# Patient Record
Sex: Female | Born: 1950 | Race: White | Hispanic: No | Marital: Married | Smoking: Never smoker
Health system: Southern US, Community
[De-identification: ages and names within clinical notes are randomized; demographics above are authoritative.]

## PROBLEM LIST (undated history)

## (undated) DIAGNOSIS — K8020 Calculus of gallbladder without cholecystitis without obstruction: Secondary | ICD-10-CM

## (undated) DIAGNOSIS — R112 Nausea with vomiting, unspecified: Secondary | ICD-10-CM

## (undated) DIAGNOSIS — G894 Chronic pain syndrome: Secondary | ICD-10-CM

## (undated) DIAGNOSIS — G4730 Sleep apnea, unspecified: Secondary | ICD-10-CM

## (undated) DIAGNOSIS — T4145XA Adverse effect of unspecified anesthetic, initial encounter: Secondary | ICD-10-CM

## (undated) DIAGNOSIS — G43909 Migraine, unspecified, not intractable, without status migrainosus: Secondary | ICD-10-CM

## (undated) DIAGNOSIS — F32A Depression, unspecified: Secondary | ICD-10-CM

## (undated) DIAGNOSIS — M1990 Unspecified osteoarthritis, unspecified site: Secondary | ICD-10-CM

## (undated) DIAGNOSIS — F329 Major depressive disorder, single episode, unspecified: Secondary | ICD-10-CM

## (undated) DIAGNOSIS — T8859XA Other complications of anesthesia, initial encounter: Secondary | ICD-10-CM

## (undated) DIAGNOSIS — Z9889 Other specified postprocedural states: Secondary | ICD-10-CM

## (undated) DIAGNOSIS — E785 Hyperlipidemia, unspecified: Secondary | ICD-10-CM

## (undated) DIAGNOSIS — M5030 Other cervical disc degeneration, unspecified cervical region: Secondary | ICD-10-CM

## (undated) DIAGNOSIS — N390 Urinary tract infection, site not specified: Secondary | ICD-10-CM

## (undated) HISTORY — PX: SACROILIAC JOINT FUSION: SHX6088

## (undated) HISTORY — PX: OTHER SURGICAL HISTORY: SHX169

## (undated) HISTORY — PX: CERVICAL DISC SURGERY: SHX588

## (undated) HISTORY — DX: Calculus of gallbladder without cholecystitis without obstruction: K80.20

## (undated) HISTORY — PX: NASAL SINUS SURGERY: SHX719

## (undated) HISTORY — DX: Hyperlipidemia, unspecified: E78.5

## (undated) HISTORY — PX: CHOLECYSTECTOMY: SHX55

## (undated) HISTORY — DX: Depression, unspecified: F32.A

## (undated) HISTORY — DX: Migraine, unspecified, not intractable, without status migrainosus: G43.909

## (undated) HISTORY — DX: Sleep apnea, unspecified: G47.30

## (undated) HISTORY — DX: Unspecified osteoarthritis, unspecified site: M19.90

## (undated) HISTORY — PX: ABDOMINAL HYSTERECTOMY: SHX81

## (undated) HISTORY — DX: Chronic pain syndrome: G89.4

## (undated) HISTORY — PX: TONSILLECTOMY: SUR1361

## (undated) HISTORY — DX: Major depressive disorder, single episode, unspecified: F32.9

---

## 1996-08-06 HISTORY — PX: OTHER SURGICAL HISTORY: SHX169

## 1997-12-16 ENCOUNTER — Ambulatory Visit (HOSPITAL_COMMUNITY): Admission: RE | Admit: 1997-12-16 | Discharge: 1997-12-16 | Payer: Self-pay | Admitting: Specialist

## 1998-02-09 ENCOUNTER — Inpatient Hospital Stay (HOSPITAL_COMMUNITY): Admission: RE | Admit: 1998-02-09 | Discharge: 1998-02-13 | Payer: Self-pay | Admitting: Specialist

## 1998-07-13 ENCOUNTER — Encounter: Admission: RE | Admit: 1998-07-13 | Discharge: 1998-10-11 | Payer: Self-pay | Admitting: Anesthesiology

## 1998-12-02 ENCOUNTER — Other Ambulatory Visit: Admission: RE | Admit: 1998-12-02 | Discharge: 1998-12-02 | Payer: Self-pay | Admitting: Obstetrics & Gynecology

## 1999-01-23 ENCOUNTER — Ambulatory Visit (HOSPITAL_COMMUNITY): Admission: RE | Admit: 1999-01-23 | Discharge: 1999-01-23 | Payer: Self-pay | Admitting: Specialist

## 1999-01-23 ENCOUNTER — Encounter: Payer: Self-pay | Admitting: Specialist

## 1999-04-14 ENCOUNTER — Encounter: Payer: Self-pay | Admitting: Specialist

## 1999-04-14 ENCOUNTER — Ambulatory Visit (HOSPITAL_COMMUNITY): Admission: RE | Admit: 1999-04-14 | Discharge: 1999-04-14 | Payer: Self-pay | Admitting: Specialist

## 1999-08-07 HISTORY — PX: OTHER SURGICAL HISTORY: SHX169

## 2000-03-01 ENCOUNTER — Other Ambulatory Visit: Admission: RE | Admit: 2000-03-01 | Discharge: 2000-03-01 | Payer: Self-pay | Admitting: Obstetrics and Gynecology

## 2000-07-26 ENCOUNTER — Encounter: Payer: Self-pay | Admitting: Specialist

## 2000-07-26 ENCOUNTER — Ambulatory Visit (HOSPITAL_COMMUNITY): Admission: RE | Admit: 2000-07-26 | Discharge: 2000-07-26 | Payer: Self-pay | Admitting: Specialist

## 2000-08-06 HISTORY — PX: ROTATOR CUFF REPAIR: SHX139

## 2001-05-09 ENCOUNTER — Other Ambulatory Visit: Admission: RE | Admit: 2001-05-09 | Discharge: 2001-05-09 | Payer: Self-pay | Admitting: Obstetrics and Gynecology

## 2001-05-28 ENCOUNTER — Encounter: Payer: Self-pay | Admitting: Specialist

## 2001-05-29 ENCOUNTER — Inpatient Hospital Stay (HOSPITAL_COMMUNITY): Admission: EM | Admit: 2001-05-29 | Discharge: 2001-05-30 | Payer: Self-pay | Admitting: Specialist

## 2001-09-04 ENCOUNTER — Encounter: Admission: RE | Admit: 2001-09-04 | Discharge: 2001-12-03 | Payer: Self-pay

## 2001-12-04 ENCOUNTER — Encounter: Admission: RE | Admit: 2001-12-04 | Discharge: 2002-03-04 | Payer: Self-pay

## 2002-03-30 ENCOUNTER — Encounter: Admission: RE | Admit: 2002-03-30 | Discharge: 2002-05-05 | Payer: Self-pay

## 2002-06-15 ENCOUNTER — Other Ambulatory Visit: Admission: RE | Admit: 2002-06-15 | Discharge: 2002-06-15 | Payer: Self-pay | Admitting: Obstetrics and Gynecology

## 2002-07-23 ENCOUNTER — Encounter: Admission: RE | Admit: 2002-07-23 | Discharge: 2002-10-21 | Payer: Self-pay

## 2002-10-21 ENCOUNTER — Encounter: Admission: RE | Admit: 2002-10-21 | Discharge: 2003-01-19 | Payer: Self-pay

## 2003-01-22 ENCOUNTER — Encounter
Admission: RE | Admit: 2003-01-22 | Discharge: 2003-04-22 | Payer: Self-pay | Admitting: Physical Medicine & Rehabilitation

## 2003-04-20 ENCOUNTER — Encounter
Admission: RE | Admit: 2003-04-20 | Discharge: 2003-07-19 | Payer: Self-pay | Admitting: Physical Medicine & Rehabilitation

## 2003-04-26 ENCOUNTER — Encounter
Admission: RE | Admit: 2003-04-26 | Discharge: 2003-04-26 | Payer: Self-pay | Admitting: Physical Medicine & Rehabilitation

## 2003-04-26 ENCOUNTER — Encounter: Payer: Self-pay | Admitting: Physical Medicine & Rehabilitation

## 2003-06-23 ENCOUNTER — Other Ambulatory Visit: Admission: RE | Admit: 2003-06-23 | Discharge: 2003-06-23 | Payer: Self-pay | Admitting: Obstetrics and Gynecology

## 2004-08-09 ENCOUNTER — Other Ambulatory Visit: Admission: RE | Admit: 2004-08-09 | Discharge: 2004-08-09 | Payer: Self-pay | Admitting: Obstetrics and Gynecology

## 2004-09-29 ENCOUNTER — Ambulatory Visit: Payer: Self-pay | Admitting: Cardiology

## 2004-10-31 ENCOUNTER — Encounter: Admission: RE | Admit: 2004-10-31 | Discharge: 2004-10-31 | Payer: Self-pay | Admitting: Obstetrics and Gynecology

## 2004-11-16 ENCOUNTER — Ambulatory Visit: Payer: Self-pay | Admitting: Cardiology

## 2005-09-10 ENCOUNTER — Other Ambulatory Visit: Admission: RE | Admit: 2005-09-10 | Discharge: 2005-09-10 | Payer: Self-pay | Admitting: Obstetrics and Gynecology

## 2005-11-02 ENCOUNTER — Encounter: Admission: RE | Admit: 2005-11-02 | Discharge: 2005-11-02 | Payer: Self-pay | Admitting: Obstetrics and Gynecology

## 2006-02-01 ENCOUNTER — Ambulatory Visit: Payer: Self-pay | Admitting: Cardiology

## 2006-02-13 ENCOUNTER — Ambulatory Visit: Payer: Self-pay | Admitting: Cardiology

## 2006-11-04 ENCOUNTER — Ambulatory Visit: Payer: Self-pay | Admitting: Gastroenterology

## 2006-11-11 ENCOUNTER — Encounter: Admission: RE | Admit: 2006-11-11 | Discharge: 2006-11-11 | Payer: Self-pay | Admitting: Obstetrics and Gynecology

## 2007-01-01 ENCOUNTER — Ambulatory Visit: Payer: Self-pay | Admitting: Gastroenterology

## 2007-01-08 ENCOUNTER — Ambulatory Visit: Payer: Self-pay | Admitting: Gastroenterology

## 2007-02-04 ENCOUNTER — Ambulatory Visit: Payer: Self-pay | Admitting: Cardiology

## 2007-08-19 ENCOUNTER — Encounter: Admission: RE | Admit: 2007-08-19 | Discharge: 2007-08-19 | Payer: Self-pay | Admitting: Anesthesiology

## 2007-12-12 ENCOUNTER — Encounter: Admission: RE | Admit: 2007-12-12 | Discharge: 2007-12-12 | Payer: Self-pay | Admitting: Obstetrics and Gynecology

## 2008-04-21 ENCOUNTER — Ambulatory Visit: Payer: Self-pay | Admitting: Cardiology

## 2008-04-21 LAB — CONVERTED CEMR LAB
ALT: 22 units/L (ref 0–35)
Cholesterol: 111 mg/dL (ref 0–200)
HDL: 83.7 mg/dL (ref >39.0)
Total Protein: 6.8 g/dL (ref 6.0–8.3)
Triglycerides: 38 mg/dL (ref 0–149)
VLDL: 8 mg/dL (ref 0–40)

## 2008-09-01 ENCOUNTER — Encounter: Admission: RE | Admit: 2008-09-01 | Discharge: 2008-09-01 | Payer: Self-pay | Admitting: Specialist

## 2008-09-08 ENCOUNTER — Ambulatory Visit (HOSPITAL_BASED_OUTPATIENT_CLINIC_OR_DEPARTMENT_OTHER): Admission: RE | Admit: 2008-09-08 | Discharge: 2008-09-08 | Payer: Self-pay | Admitting: Specialist

## 2008-09-08 HISTORY — PX: OTHER SURGICAL HISTORY: SHX169

## 2008-12-20 ENCOUNTER — Encounter: Admission: RE | Admit: 2008-12-20 | Discharge: 2008-12-20 | Payer: Self-pay | Admitting: Obstetrics and Gynecology

## 2009-02-28 ENCOUNTER — Encounter (INDEPENDENT_AMBULATORY_CARE_PROVIDER_SITE_OTHER): Payer: Self-pay | Admitting: *Deleted

## 2009-06-02 DIAGNOSIS — E785 Hyperlipidemia, unspecified: Secondary | ICD-10-CM | POA: Insufficient documentation

## 2009-06-09 ENCOUNTER — Encounter: Admission: RE | Admit: 2009-06-09 | Discharge: 2009-06-09 | Payer: Self-pay | Admitting: Family Medicine

## 2009-07-04 ENCOUNTER — Telehealth: Payer: Self-pay | Admitting: Cardiology

## 2009-07-05 ENCOUNTER — Encounter (INDEPENDENT_AMBULATORY_CARE_PROVIDER_SITE_OTHER): Payer: Self-pay | Admitting: *Deleted

## 2009-07-05 ENCOUNTER — Ambulatory Visit: Payer: Self-pay | Admitting: Cardiology

## 2009-09-16 ENCOUNTER — Encounter: Admission: RE | Admit: 2009-09-16 | Discharge: 2009-09-16 | Payer: Self-pay | Admitting: Anesthesiology

## 2009-11-02 ENCOUNTER — Encounter: Admission: RE | Admit: 2009-11-02 | Discharge: 2009-11-02 | Payer: Self-pay | Admitting: Neurological Surgery

## 2009-12-21 ENCOUNTER — Encounter: Admission: RE | Admit: 2009-12-21 | Discharge: 2009-12-21 | Payer: Self-pay | Admitting: Obstetrics and Gynecology

## 2010-06-07 ENCOUNTER — Ambulatory Visit (HOSPITAL_COMMUNITY): Admission: RE | Admit: 2010-06-07 | Discharge: 2010-06-10 | Payer: Self-pay | Admitting: Neurosurgery

## 2010-07-03 ENCOUNTER — Encounter (INDEPENDENT_AMBULATORY_CARE_PROVIDER_SITE_OTHER): Payer: Self-pay | Admitting: *Deleted

## 2010-08-27 ENCOUNTER — Encounter: Payer: Self-pay | Admitting: Physical Medicine & Rehabilitation

## 2010-08-27 ENCOUNTER — Encounter: Payer: Self-pay | Admitting: Neurological Surgery

## 2010-08-27 ENCOUNTER — Encounter: Payer: Self-pay | Admitting: Family Medicine

## 2010-09-07 NOTE — Letter (Signed)
Summary: Appointment - Reminder 2  [facility]. [address] [place] [address]   Phone: [phone]  Fax: [phone]     July 03, 2010 MRN: [id number]   [facility] [address] [place], [place]  [address]   Dear Ms. Bommarito,  Our records indicate that it is time to schedule a follow-up appointment.  Dr.  Daleen Squibb        recommended that you follow up with [place] in   06/2010 PAST DUE         . It is very important that we reach you to schedule this appointment. We look forward to participating in your health care needs. Please contact us at the number listed above at your earliest convenience to schedule your appointment.  If you are unable to make an appointment at this time, give [place] a call so we can update our records.     Sincerely,   Glass blower/designer

## 2010-10-17 LAB — BASIC METABOLIC PANEL
Calcium: 9 mg/dL (ref 8.4–10.5)
GFR calc Af Amer: >60 mL/min (ref >60)
GFR calc non Af Amer: >60 mL/min (ref >60)
Glucose, Bld: 121 mg/dL — ABNORMAL HIGH (ref 70–99)
Sodium: 141 mEq/L (ref 135–145)

## 2010-10-18 LAB — CBC
MCH: 28.2 pg (ref 26.0–34.0)
MCHC: 31.8 g/dL (ref 30.0–36.0)
Platelets: 243 10*3/uL (ref 150–400)
RBC: 4.79 MIL/uL (ref 3.87–5.11)

## 2010-10-18 LAB — BASIC METABOLIC PANEL
CO2: 29 mEq/L (ref 19–32)
Calcium: 9.6 mg/dL (ref 8.4–10.5)
Creatinine, Ser: 0.77 mg/dL (ref 0.4–1.2)
GFR calc Af Amer: >60 mL/min (ref >60)

## 2010-10-18 LAB — SURGICAL PCR SCREEN: MRSA, PCR: NEGATIVE

## 2010-11-20 LAB — BASIC METABOLIC PANEL
Calcium: 9.5 mg/dL (ref 8.4–10.5)
Chloride: 101 mEq/L (ref 96–112)
Creatinine, Ser: 0.92 mg/dL (ref 0.4–1.2)
GFR calc Af Amer: >60 mL/min (ref >60)
GFR calc non Af Amer: >60 mL/min (ref >60)

## 2010-11-20 LAB — CBC
RBC: 4.78 MIL/uL (ref 3.87–5.11)
WBC: 9.8 10*3/uL (ref 4.0–10.5)

## 2010-12-19 NOTE — Op Note (Signed)
Melody Huerta, Melody Huerta                ACCOUNT NO.:  [id number]   MEDICAL RECORD NO.:  [id number]          PATIENT TYPE:  AMB   LOCATION:  [facility]                         FACILITY:  [facility]   PHYSICIAN:  Jene Every, M.D.    DATE OF BIRTH:  [birth year]   DATE OF PROCEDURE:  DATE OF DISCHARGE:                               OPERATIVE REPORT   PREOPERATIVE DIAGNOSES:  1. Adhesive capsulitis.  2. Impingement syndrome of the left shoulder.   POSTOPERATIVE DIAGNOSES:  1. Adhesive capsulitis.  2. Impingement syndrome of the left shoulder.  3. Anterior labral tear.  4. Partial tear of the rotator cuff.   PROCEDURES:  1. Exam under anesthesia and manipulation under anesthesia.  2. Left shoulder arthroscopy with debridement of the glenohumeral      joint and debridement of anterior labral tear.  3. Subacromial decompression, acromioplasty, bursectomy and resection      of the CA ligament, debridement of rotator cuff tear.   ANESTHESIA:  General.   ASSISTANT:  Roma Schanz, P.A.   BRIEF HISTORY AND INDICATIONS:  This is a 60 year old female who has had  multiple problems, including cervical spondylosis.  She recently had a  frozen shoulder, refractory to conservative treatment, including  corticosteroid injections, physical therapy and activity modification,  who is indicated for a manipulation under anesthesia, evaluation of the  rotator cuff and glenohumeral joint, open rotator cuff repair if noted.  The risks and benefits discussed, including bleeding, infection,  fracture, tear, need for open repair, recurrent adhesive capsulitis,  etc.   TECHNIQUE:  With the patient in supine beach-chair position after  induction of adequate anesthesia and a gram of Kefzol, the shoulder was  examined and manipulated under anesthesia.  She had active abduction  only to 40 degrees, forward flexion to 60 degrees, external rotation to  15 degrees, internal rotation was limited as well.  First in the  forward  flexion plane, gentle pressure was applied after stabilization of the  glenohumeral joint with close securing of the humerus and we were Prunty  to achieve and appreciate lysis of adhesions with improvement of her  range of motion to 160 degrees of forward flexion.  Then we were Mcanany to  obtain abduction to 120 degrees.  An external rotation then was improved  to 40 degrees and internal rotation was improved as well.  At this  point, we felt we had achieved maximum increase in motion by  manipulation and this was compared contralateral side.  Next, the left  shoulder and upper extremities was prepped and draped in the usual  sterile fashion.  Using a surgical marker to delineate the acromion, the  [facility] joint and the coracoid, an incision posterolaterally, anterolaterally  and anteriorly through the skin only.  The usual portals were utilized.  With the arm in the 70/30 position, we advanced the cannula into the  glenohumeral space without difficulty.  This in line with the coracoid.  Noted was some tearing of the anterior labrum, minimal detachment,  minimal tearing, no full detachment.  There was no SLAP 2 type lesion.  We introduced  the shaver.  With the cannula from the anterior portal  after advancing it under direct visualization through the capsule  beneath the biceps tendon into the glenohumeral joint without  difficulty, we debrided the labrum to a stable base.  Minor degenerative  changes of the glenohumeral joint were noted.  The rotator cuff appeared  to be intact.  There was a small fraying of the rotator cuff near the  rotator cuff interval.  The subscap was unremarkable as was the  glenohumeral joint.  We then redirected the cannula into the subacromial  space in with the cannula alone, I completed lysis of adhesions  anteriorly, laterally and posteriorly.  We then transferred the cannula  from the anterior to the lateral portal and under direct visualization  proceeded  with evaluation of the subacromial joint.  Hypertrophic and  exuberant bursa and synovitis was noted and this was shaved with a 3.5  shaver.  The subacromial space appeared to be tight.  We therefore  released the morselized CA ligament and improved the subacromial space.  We shaved the anterolateral aspect of the acromion as well.  This was to  the [facility] joint.  Full bursectomy was performed.  There was a minor  superficial abrasion tear of the rotator cuff.  This was debrided to  good bleeding tissue.  A full bursectomy again was performed and we  evaluated the cuff anteriorly, posteriorly, laterally without a full-  thickness tear.  This was probed extensively.  We felt there was  significant improvement in the subacromial space following the  decompression.  Next, all instrumentation was removed.  The portals were  closed with 4-0 nylon simple sutures.  Quarter-percent Marcaine with  epinephrine was infiltrated in the joint.  The wound was dressed  sterilely, placed in a sling, extubated without difficulty and  transported to the recovery room in satisfactory condition.   The patient tolerated the procedure well will no complications.      Jene Every, M.D.  Electronically Signed     JB/MEDQ  D:  09/08/2008  T:  09/08/2008  Job:  [id number]

## 2010-12-19 NOTE — Assessment & Plan Note (Signed)
[facility]                            CARDIOLOGY OFFICE NOTE   Melody, CORREIRA                       MRN:          [id number]  DATE:04/21/2008                            DOB:          25-Apr-1951    Melody Huerta returns today for further management of her hyperlipidemia.  She is back on Vytorin 10/40 and is doing remarkably well.  She is due  lipids and LFTs.  She has no complaints of ischemic heart disease.   PHYSICAL EXAMINATION:  VITAL SIGNS:  Her blood pressure is 108/78.  Her  pulse is 71 and regular.  Her weight is 179, up 9.  HEENT:  Normal.  NECK:  Carotid upstrokes were equal bilaterally without bruits.  No JVD.  Thyroid is not enlarged.  Trachea is midline.  Neck is supple.  LUNGS:  Clear to auscultation and percussion.  HEART:  Soft S1 and S2.  No gallop.  ABDOMEN:  Soft.  Good bowel sounds.  EXTREMITIES:  No cyanosis, clubbing, or edema.  Pulses are brisk.  NEURO:  Intact.  SKIN:  Unremarkable.   EKG is normal except for some slight nonspecific changes.   Melody Huerta is doing well on Vytorin.  We will repeat her labs.  If these  were at goal, we will see her back again in a year.     Thomas C. Daleen Squibb, MD, [facility]  Electronically Signed    TCW/MedQ  DD: 04/21/2008  DT: 04/22/2008  Job #: [id number]   cc:   Guy Sandifer. Henderson Cloud, M.D.

## 2010-12-19 NOTE — Assessment & Plan Note (Signed)
[facility]                            CARDIOLOGY OFFICE NOTE   SANAI, FRICK                       MRN:          [id number]  DATE:02/04/2007                            DOB:          1951-05-18    Ms. Diluzio returns today for further management of her hyperlipidemia.   Unfortunately, she had difficulty getting an appointment with me back in  the winter.  She then saw Dr. Thyra Breed, who she sees for chronic  pain, who told her Vytorin was not working.  I guess this was based on a  peripheral understanding of the ENHANCE trial.  Unfortunately, she has  not been on the drug for 3 months.   She continues with her therapeutic lifestyle changes, and her weight  loss is about 15 to 18 pounds since we first met in 2006.  She is out of  her Vytorin 10/40.   The rest of her medications are unchanged, except now she is on Opana 30  mg q.12 for chronic pain, and she is also taking Celebrex 200 mg a day.   Her blood pressure is 118/74.  Pulse is 71 and regular.  Her weight is  170.  HEENT:  Normocephalic and atraumatic.  PERRLA.  Extraocular movements  intact.  Sclerae are clear.  Facial symmetry is normal.  Carotid upstrokes were equal bilaterally without bruits.  No JVD.  Thyroid is not enlarged.  Trachea is midline.  LUNGS:  Clear.  HEART:  Reveals a regular rate and rhythm without gallop.  ABDOMINAL EXAM:  Soft with good bowel sounds.  EXTREMITIES:  No cyanosis, clubbing, or edema.  Pulses are intact.  NEUROLOGIC:  Exam is intact.  EKG:  Normal.   I had a long talk (greater than 20 minutes) with Ms. Miano.  We reviewed  the ENHANCE trial, and its limitations.  After our discussion, she wants  to go back on Vytorin 10/40.  She had excellent results with this  before.   PLAN:  1. Begin Vytorin 10/40 nightly.  2. Follow up lipids and LFTs in 6 weeks.  3. See me back in a year.   Hopefully in 2 years we will have some good outcome to data that  will  make statistical significance with the IMPROVE IT trial.     Maisie Fus C. Daleen Squibb, MD, [facility]  Electronically Signed    TCW/MedQ  DD: 02/04/2007  DT: 02/05/2007  Job #: [id number]   cc:   Guy Sandifer. Henderson Cloud, M.D.

## 2010-12-22 NOTE — Consult Note (Signed)
NAME:  Melody Huerta, Melody Huerta                            ACCOUNT NO.:   MEDICAL RECORD NO.:  [id number]                   PATIENT TYPE:   LOCATION:                                       FACILITY:  [facility]   PHYSICIAN:  Sondra Come, D.O.                 DATE OF BIRTH:   DATE OF CONSULTATION:  04/29/2002  DATE OF DISCHARGE:                                   CONSULTATION   Ms. Dipalma returns to clinic today as scheduled for reevaluation.  She was  last seen on April 01, 2002.  Overall, she is doing very well with her  current medication regimen.  She continues on Duragesic 50 mcg patches with  rare use of oxycodone for breakthrough pain.  She typically uses  approximately 10 oxycodone per month.  She also continues on Elavil 25 mg at  bedtime and Neurontin 300 mg t.i.d.  She takes Celebrex 200 mg b.i.d.  She  states that she continues her walking program and aquatic therapy, but has  discontinued the aggressive exercise program with her personal trainer and  massage therapist secondary to her persistent aggravation of her left  iliotibial band syndrome.  She feels that the rest has decreased her left  lateral thigh pain and knee pain to some degree.  She did not really get any  significant improvement with her iliotibial band syndrome following a local  steroid injection at last visit.  She is satisfied with her current pain  control.  Her pain today is rated as a 5/10 on a subjective scale but she  seems to be functioning very well with a stable function and quality of life  measure.  Her sleep is good.  I reviewed the health and history form and 14  point review of systems.  No new neurologic complaints.   PHYSICAL EXAMINATION:  GENERAL:  Healthy female in no acute distress.  VITAL SIGNS:  Blood pressure 119/59, pulse 77, respirations 16, O2  saturation 98% on room air.  BACK:  Level pelvis without scoliosis.  There is minimal tenderness to  palpation in the lumbar paraspinal muscles.  There  is significant tenderness  over the left sacroiliac joint region.  NEUROLOGIC:  Manual muscle testing is 5/5 bilateral lower extremities.  Sensory examination intact to light touch bilateral lower extremities.  Muscle stretch reflexes are 2+/4 bilateral patellar, medial hamstrings, and  Achilles.  There is mild tenderness to palpation over the left lateral knee  at the iliotibial band.   IMPRESSION:  1. Chronic low back pain with degenerative disk disease of the lumbar spine     status post L4-5 and L5-S1 fusion with ray cages and pedicle screws.  2. Left sacroiliac pain status post left sacroiliac joint fusion.  3. Left iliotibial band syndrome, improved.  4. Status post bilateral pyriformis release.   PLAN:  1. Continue conservative measures at this point.  Will continue Duragesic 50     mcg q.72h. number 10 without refills.  2. Continue Elavil 25 mg one p.o. q.h.s. as needed for sleep number 90 with     one refill.  This is a three month prescription with refill.  3. Will trial decreasing the Celebrex to 200 mg q.d.  4. Continue Neurontin 300 mg t.i.d. for now.  5. Continue glucosamine and chondroitin.  6. Continue home exercise and aquatic exercise program.  7. The patient is to return to clinic in three months for reevaluation.  I     have instructed her to call us at least 72 hours prior to running out of     her medications for refills on the Duragesic.  The patient has been very     compliant over the past several months with her medication regimen and I     think we can start spreading out her appointment visits in this regard.   The patient was educated on the above findings and recommendations and  understands.  No barriers to communication.                                               Sondra Come, D.O.    JJW/MEDQ  D:  04/29/2002  T:  04/29/2002  Job:  [id number]

## 2010-12-22 NOTE — Consult Note (Signed)
[name], [name]                          ACCOUNT NO.:  [id number]   MEDICAL RECORD NO.:  [id number]                   PATIENT TYPE:  REC   LOCATION:  [facility]                                  FACILITY:  [facility]   PHYSICIAN:  Zachary George, DO                      DATE OF BIRTH:  Oct 20, 1950   DATE OF CONSULTATION:  10/22/2002  DATE OF DISCHARGE:                                   CONSULTATION   HISTORY OF PRESENT ILLNESS:  The patient returns to the clinic today for  reevaluation.  She was last seen on September 24, 2002.  She underwent  trigger point injection to the right levator scapulae muscle which she  states helped significantly, but she requests another injection.  Her pain  in that region is back to baseline.  She has been receiving what sounds like  massage-type therapy.  She is also self-treating with use of a tennis ball.  Her pain today is 6/10 on a subjective scale.  She continues to have lower  back pain which has been stable.  She continues using Duragesic 50 mcg  patches as well as hydrocodone 10 mg/325 mg as needed and brings her pills  with her for appropriate pill count.  She has more than half of her previous  prescription left.  I reviewed the health and history form and 14-point  review of systems.   PHYSICAL EXAMINATION:  GENERAL:  Healthy female in no acute distress.  VITAL SIGNS:  Blood pressure 120/72, pulse 83, respirations 20, O2  saturation 99% on room air.  NEUROLOGIC:  Intact in the upper and lower extremities including motor,  sensory, and reflexes.  Palpatory examination reveals an active trigger  point in the right levator scapulae muscle.   IMPRESSION:  1. Myofascial pain syndrome with above-noted trigger point.  2. Chronic low back pain.  3. Degenerative disk disease of the lumbar spine, status post L4-5 and L5-S1     fusion with Ray cages and pedical screws.  4. Left sacroiliac joint pain, chronic, status post left sacroiliac joint     infusion.  5. Left iliotibial band syndrome, chronic and stable.   PLAN:  1. Repeat trigger point injection to the right levator scapulae muscle.  2. Continue Duragesic 50 mcg q.72h., #10 without refills.  3. Continue Norco 10 mg/325 mg, 1/2-1 p.o. three times daily as needed for     breakthrough pain.  The patient is using this sparingly and     appropriately.  4. Patient to return to clinic in one month for reevaluation.   PROCEDURE:  Trigger point injection right levator scapulae muscle:  The  procedure was described to the patient in detail including risks, benefits,  limitations, alternatives, and potential side effects.  The risks include  but are not limited to bleeding, infection, nerve injury, pneumothorax,  failure to relieve pain, increased  pain, and allergic reaction to  medication.  The patient understands and wishes to proceed.  Informed  consent was obtained.  The skin was prepped in the usual fashion with  alcohol swabs.  The right levator scapulae trigger point was injected with  0.5 mL of 1% lidocaine plus 0.5 mL of 0.25% bupivacaine using a 25-gauge, 1-  1/2-inch needle.  There were no complications.  The patient tolerated the  procedure well.  Discharge instructions were given.  The patient was  released in stable condition.   The patient was educated on the above findings and recommendations and  understands.  There were no barriers to communication.                                               Zachary George, DO    JW/MEDQ  D:  10/22/2002  T:  10/23/2002  Job:  [id number]

## 2010-12-22 NOTE — Consult Note (Signed)
[facility]  Patient:    Melody Huerta, Melody Huerta Visit Number: [id number] MRN: [id number]          Service Type: PMG Location: [facility] Attending Physician:  Melody Huerta Dictated by:   Melody Huerta, D.O. Proc. Date: 12/31/01 Admit Date:  12/04/2001   CC:         Melody Huerta, M.D.   Consultation Report  Melody Huerta returns to clinic today as scheduled for reevaluation.  She was last seen on Dec 08, 2001.  She continues to do very well in terms of pain management with Duragesic 50 mg q.72h.  She also continues on Celebrex 200 mg b.i.d. and Neurontin 300 mg t.i.d.  She is sleeping great with Elavil 25 mg at bedtime.  She has used oxycodone 5 mg for breakthrough pain very sparingly. In fact, she has greater than 20 pills left on her prescription written on November 06, 2001 for a total of 60 pills.  I review health and history form and 14 point review of systems.  Patients pain level is a 3/10 on a subjective scale.  Her function and quality of life indexes have improved significantly. She continues her exercise program.  Overall, she has made significant progress.  PHYSICAL EXAMINATION  GENERAL:  Healthy female in no acute distress.  VITAL SIGNS:  Blood pressure 133/59, pulse 86, respirations 12, O2 saturation 99% on room air.  BACK:  Minimal tenderness to palpation bilateral lumbar paraspinals.  NEUROLOGIC:  Manual muscle testing is 5/5 bilateral lower extremities. Sensory examination intact to light touch bilateral lower extremities.  Muscle stretch reflexes are 3+/4 bilateral patellar, 2+/4 bilateral medial hamstrings and Achilles.  IMPRESSION: 1. Chronic low back pain with degenerative disk disease of the lumbar spine    status post L4-5 and L5-S1 fusion with ray cages and pedicle screws. 2. Status post left sacroiliac joint fusion. 3. Status post bilateral pyriformis release. 4. Status post right rotator cuff repair.  PLAN: 1. Continue Duragesic  50 mcg q.72h. #10 without refills. 2. Continue current prescription for oxycodone 5 mg p.r.n. for breakthrough    pain. 3. Continue Neurontin 300 mg t.i.d. #270 without refills.  This will be mailed    in. 4. Continue Elavil 25 mg one p.o. q.h.s. #90 without refills, to be mailed in. 5. Continue Celebrex 200 mg one p.o. b.i.d. #180 without refills, to be mailed    in. 6. Patient to return to clinic in one month.  Patient was educated on the above findings and recommendations and understands.  No barriers to communication. Dictated by:   Melody Huerta, D.O. Attending Physician:  Melody Huerta DD:  12/31/01 TD:  01/01/02 Job: [id number]

## 2010-12-22 NOTE — Assessment & Plan Note (Signed)
[facility]                              CARDIOLOGY OFFICE NOTE   Melody Huerta, Melody Huerta                       MRN:          [id number]  DATE:02/13/2006                            DOB:          03/15/1951    Melody Huerta returns today for further management of her hyperlipidemia.  She  has lost from 185 to 168 on my scales!  She is walking about two hours a  week.  She is taking Vytorin 10/40.  Her lipid panel showed a total  cholesterol of 142, triglycerides of 36, HDL of 62.2, LDL of 73 with LFTs  being normal.   She has no complaints today.  She is extremely proactive.   Her medications are outlined in the medication list.   Her blood pressure is 112/80, pulse is 80 and regular, weight is 168.  Carotids are full, there is no JVD.  Thyroid is not enlarged.  Trachea is  midline.  There are no bruits.  LUNGS:  Clear.  HEART:  Reveals a regular rate and rhythm.  ABDOMEN:  Soft.  EXTREMITIES:  Reveal no edema.  Pulses are intact.   I am delighted with how Melody Huerta is doing. We have made no changes in her  program. We renewed her Vytorin 10/40.  We will see her back in a year.                               Thomas C. Daleen Squibb, MD, [facility]    TCW/MedQ  DD:  02/13/2006  DT:  02/13/2006  Job #:  [id number]   cc:   Guy Sandifer. Henderson Cloud, MD

## 2010-12-22 NOTE — Consult Note (Signed)
[facility]  Patient:    Melody Huerta, Melody Huerta Visit Number: [id number] MRN: [id number]          Service Type: PMG Location: [facility] Attending Physician:  Sondra Come Dictated by:   Sondra Come, D.O. Proc. Date: 12/08/01 Admit Date:  12/04/2001   CC:         Javier Docker, M.D.   Consultation Report  Melody Huerta returns to clinic today as scheduled for reevaluation.  She was last seen on November 06, 2001 at which time I increased her Duragesic dose to 50 mcg which she states has helped significantly.  Her pain has maintained a level of 3/10 on a subjective scale.  She has noticed a decrease in the need to use oxycodone for breakthrough pain.  She has used it sparingly and has used possibly half her prescribed medication.  She continues on Neurontin, Celebrex, and amitriptyline as well as glucosamine and chondroitin.  She also continues on Zoloft 100 mg daily and asked to possibly decrease this as she is no longer feeling depressed given her significant pain control.  She feels like she has gotten her life back.  She continues to participate in aquatic therapy as well as exercise with a Systems analyst.  I review health and history form and 14 point review of systems.  Her function and quality of life indexes have significantly improved.  Her sleep is better.  PHYSICAL EXAMINATION  GENERAL:  Healthy female in no acute distress.  VITAL SIGNS:  Blood pressure 137/82, pulse 80, respirations 16, O2 saturation 99% on room air.  NEUROLOGIC:  No new neurologic findings in the lower extremities including motor, sensory, and reflexes.  BACK:  There is minimal tenderness to palpation bilateral lumbar paraspinals, especially over the right sacroiliac joint region.  IMPRESSION: 1. Chronic low back pain with degenerative disk disease of the lumbar spine    status post L4-5 and L5-S1 fusion with ray cages and pedicles screws. 2. Status post left sacroiliac joint  effusion. 3. Status post bilateral piriformis release. 4. Status post right rotator cuff repair. 5. Nonrestorative sleep disorder, improved. 6. Improved function and quality of life indexes.  PLAN: 1. Continue Duragesic 50 mcg one q.72h. #10 without refills. 2. Continue current prescription for oxycodone 5 mg p.r.n. for breakthrough    pain. 3. Continue other medications including Neurontin, Celebrex, amitriptyline,    glucosamine, chondroitin. 4. Decrease Zoloft to 50 mg q.d.  Patient may break her current pills in half. 5. Patient to continue with exercise program. 6. Patient to return to clinic in one month for reevaluation.  Patient was educated on the above findings and recommendations and understands.  No barriers to communication. Dictated by:   Sondra Come, D.O. Attending Physician:  Sondra Come DD:  12/08/01 TD:  12/09/01 Job: [id number]

## 2010-12-22 NOTE — Assessment & Plan Note (Signed)
 HEALTHCARE                         GASTROENTEROLOGY OFFICE NOTE   Melody Huerta, Melody Huerta                       MRN:          [id number]  DATE:11/04/2006                            DOB:          07-May-1951    REFERRING PHYSICIAN:  Guy Sandifer. Henderson Cloud, M.D.   REASON FOR CONSULTATION:  Screening colonoscopy.   Ms. Melody Huerta is a pleasant, 60 year old, white female referred through the  courtesy of Dr. Henderson Cloud for evaluation. She suffers from chronic  constipation which she attributes to her narcotic use. She has chronic  back pain that requires a fentanyl patch. There is no history of melena  or hematochezia. She does complain of a frequent foul taste in her mouth  and halitosis. Prilosec has not helped. She denies hoarseness, sore  throat, or pyrosis, per say.   PAST MEDICAL HISTORY:  Remarkable for depression and chronic headaches.  She is status post hysterectomy. She has had several back surgeries.   FAMILY HISTORY:  Pertinent for a father with polyps.   MEDICATIONS:  Duragesic patch, Topamax, Vivelle patch, Vytorin, Remeron,  Celebrex, Effexor, __________ and chlorpromazine.   ALLERGIES:  She is allergic to PENICILLIN.   SOCIAL HISTORY:  She neither smokes nor drinks. 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   REVIEW OF SYSTEMS:  Positive for chronic low back pain, night sweats,  headaches.   PHYSICAL EXAMINATION:  GENERAL:  She is a well-developed female.  VITAL SIGNS:  Pulse 76, blood pressure 104/60, weight 171.  HEENT: EOMI. PERRLA. Sclerae are anicteric.  Conjunctivae are pink.  NECK:  Supple without thyromegaly, adenopathy or carotid bruits.  CHEST:  Clear to auscultation and percussion without adventitious  sounds.  CARDIAC:  Regular rhythm; normal S1 S2.  There are no murmurs, gallops  or rubs.  ABDOMEN:  Bowel sounds are normoactive.  Abdomen is soft, non-tender and  non-distended.  There are no abdominal masses, tenderness,  splenic  enlargement or hepatomegaly.  EXTREMITIES:  Full range of motion.  No cyanosis, clubbing or edema.  RECTAL:  Deferred.   IMPRESSION:  1. Chronic constipation - likely secondary to narcotics.  2. Dysgeusia.  This may be a manifestation of gastroesophageal reflux      disease.   RECOMMENDATION:  1. Screening colonoscopy.  2. Trial of AcipHex 30 mg a day. Failing this, I would consider upper      endoscopy.     Barbette Hair. Arlyce Dice, MD,FACG  Electronically Signed    RDK/MedQ  DD: 11/04/2006  DT: 11/04/2006  Job #: [id number]   cc:   Guy Sandifer. Henderson Cloud, M.D.

## 2010-12-22 NOTE — Letter (Signed)
November 04, 2006    Guy Sandifer. Henderson Cloud, M.D.  [address]  [place], [place] [address]   RE:  Melody Huerta, Melody Huerta  MRN:  [id number]  /  DOB:  [birth year]   Dear Dr. Henderson Cloud:   Upon your kind referral, I had the pleasure of evaluating your patient  and I am pleased to offer my findings.  I saw Melody Huerta in the office  today.  Enclosed is a copy of my progress note that details my findings  and recommendations.   Thank you for the opportunity to participate in your patient's care.    Sincerely,      Barbette Hair. Arlyce Dice, MD,FACG  Electronically Signed    RDK/MedQ  DD: 11/04/2006  DT: 11/04/2006  Job #: [id number]

## 2010-12-22 NOTE — Consult Note (Signed)
[facility]  Patient:    Melody Huerta, Melody Huerta Visit Number: [id number] MRN: [id number]          Service Type: PMG Location: [facility] Attending Physician:  Sondra Come Dictated by:   Sondra Come, D.O. Proc. Date: 09/05/01 Admit Date:  09/04/2001   CC:         Melody Huerta, M.D.   Consultation Report  Dear Dr. Shelle Huerta:  Thank you very much for kindly referring Ms. Elloise [facility] to [facility].  She was evaluated in our clinic today. Please refer to the following for details regarding the history and physical examination and treatment plan.  Once again, thank you for allowing [place] to participate in the care of Ms. Melody Huerta.  CHIEF COMPLAINT:  Low back pain and left buttock pain.  HISTORY OF PRESENT ILLNESS:  Melody Huerta is a pleasant 60 year old left hand dominant female who is status post L4-5 and L5-S1 ray cage fusion in 1999 with revision in June 2002 with pedicle screws, status post left sacroiliac joint fusion in 2001, and status post bilateral pyriformis release in December 2000 who presents to clinic today complaining of persistent pain in her low back and into her lower extremities, left greater than right.  Currently, her pain is an 8/10 on a subjective scale and described as burning sensation.  Her symptoms are worse with walking, bending, and sitting and improved with ice and medications which currently include OxyContin 20 mg b.i.d. for which she has taken over the past two years, oxycodone 5 mg two to three per day for breakthrough pain, cyclobenzaprine 10 mg as needed, but infrequently.  The patients function and quality of life indexes have declined.  Her sleep is poor despite taking Ambien 10 mg at bedtime which she has done over the past one to two years.  The patient denies bowel and bladder dysfunction.  She admits to some paraesthesias in her feet bilaterally.  She admits to constipation.  I review the  health and history form and 14 point review of systems.  PAST MEDICAL HISTORY:  Denies.  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  Patient has also undergone left sacroiliac joint fusion, bilateral pyriformis release, and right rotator cuff repair.  FAMILY HISTORY:  Heart disease, diabetes.  SOCIAL HISTORY:  Denies smoking.  Admits to occasional alcohol use.  She is married and not currently working secondary to disability.  ALLERGIES:  PENICILLIN.  MEDICATIONS: 1. Estratest. 2. Zoloft 100 mg. 3. OxyContin 20 mg b.i.d. 4. Oxycodone 5 mg two to three q.d. for breakthrough pain. 5. Ambien 10 mg at bedtime. 6. Cyclobenzaprine 10 mg p.r.n.  Patient has tried anti-inflammatory medication in the past including Celebrex, Vioxx, and Bextra which did not offer significant relief but she believes that she had only taken it for short periods of time.  PHYSICAL EXAMINATION  GENERAL:  Healthy female in no acute distress who prefers standing secondary to discomfort with sitting.  Mood and affect are appropriate.  VITAL SIGNS:  Blood pressure 141/94, pulse 105, respirations 12, O2 saturation 98%.  BACK:  Spine reveals a level pelvis without gross scoliosis.  There is decreased lumbar lordosis.  There is a midline incisional scar and scars bilaterally over the sacroiliac regions.  Range of motion is limited in all planes secondary to pain.  NEUROLOGIC:  Manual muscle  testing is 5/5 bilateral lower extremities in all muscle groups tested.  Sensory examination reveals slight decrease to light touch in the feet bilaterally, especially on the left lateral foot and plantar surface compared to the right.  Muscle stretch reflexes are 2+/4 bilateral patellar, medial hamstrings, and 1+/4 bilateral Achilles.  There is no heat, erythema, or edema in the lower  extremities.  Distal pulses are present bilaterally.  Melody Huerta is negative bilaterally.  Straight leg raise is equivocal on the left, negative on the right.  Records which were sent to our office from Dr. Shelle Huerta were reviewed.  IMPRESSION: 1. Degenerative disk disease of the lumbar spine status post L4-5 and L5-S1    fusion with ray cages and pedicle screws.  Patient has persistent low back    pain with left lower extremity radicular symptoms in an S1 distribution. 2. Status post left sacroiliac joint fusion. 3. Status post bilateral pyriformis release. 4. Status post right rotator cuff repair. 5. Nonrestorative sleep disorder.  PLAN: 1. I had a long discussion with Melody Huerta regarding her pain condition and    treatment options.  This was an extensive consultation of greater than 30    minutes face to face time.  I would like to gather more information    including most recent radiologic studies including MRI of her lumbar spine    for enhanced database. 2. Will perform electrodiagnostic studies on the left lower extremity to    evaluate for a left S1 radiculopathy. 3. Will consider further interventional procedures such as a left S1 selective    transforaminal epidural steroid injection.  I discussed this at length with    Melody Huerta who will consider this. 4. Will discontinue Ambien at this time to give her a holiday from the    medication as its effectiveness has diminished for her sleep capacity.  I    will start her on Elavil 25 mg one p.o. q.h.s. 5. Will begin Celebrex 200 mg b.i.d. and have given her two weeks worth of    samples. 6. Will begin Neurontin 300 mg p.o. q.h.s. x3 days, b.i.d. x3 days, then    t.i.d.  Will titrate this to effect to assist with neuropathic component. 7. Will continue OxyContin 20 mg b.i.d. at this time.  Patient has    approximately three weeks left on her prescription.  Will also continue    oxycodone 5 mg for breakthrough pain at this time.  Will  consider     transitioning her from OxyContin to a different long-acting narcotic such    as Duragesic patch to get her away from mono therapy which would likely be    more efficacious in treating her pain. 8. Will have her discontinue cyclobenzaprine. 9. Patient to return to clinic for electrodiagnostic studies.  Patient was educated on the above findings and recommendations and understands.  There were no barriers to communication. Dictated by:   Sondra Come, D.O. Attending Physician:  Sondra Come DD:  09/07/01 TD:  09/08/01 Job: [id number]

## 2010-12-22 NOTE — Consult Note (Signed)
[facility]  Patient:    Melody Huerta, Melody Huerta Visit Number: [id number] MRN: [id number]          Service Type: PMG Location: [facility] Attending Physician:  Sondra Come Dictated by:   Sondra Come, D.O. Proc. Date: 01/28/02 Admit Date:  12/04/2001                            Consultation Report  REASON FOR FOLLOWUP:  The patient returns to clinic today as scheduled for reevaluation.  She was last seen on Dec 31, 2001.  She continues to do very well overall in terms of pain management with Duragesic patches 50 mcg q.72h., Neurontin 300 mg three times daily, Celebrex 200 mg b.i.d., glucosamine and chondroitin as well as Elavil for sleep.  Today, she complains mainly of pain in her distal iliotibial band at the knees bilaterally, the left considerably greater than right.  She notes she has been walking more over the past few weeks and feels that her symptoms have progressed.  Her pain today is a 4/10 on a subjective scale.  Function and quality-of-life indices remain the same and improved overall.  Her sleep is great.  I reviewed the health and history form and 14-point review of systems.  PHYSICAL EXAMINATION:  GENERAL:  Physical examination reveals a healthy female in no acute distress.  VITAL SIGNS:  Blood pressure 130/59, pulse 80, respirations 16, O2 saturation 100% on room air.  EXTREMITIES:  There is tenderness to palpation mainly at the left iliotibial band at the knee.  There is some tightness of the left iliotibial band on Ober test.  NEUROLOGIC:  Neurologic exam is intact in the lower extremities without change.  IMPRESSION: 1. Left iliotibial band syndrome. 2. Chronic low back pain with degenerative disk disease of the lumbar spine,    status post L4-5 and L5-S1 fusion with Ray cages and pedicle screws. 3. Status post left sacroiliac joint fusion. 4. Status post bilateral piriformis release. 5. Status post right rotator cuff  repair.   PLAN: 1. Continue Duragesic 50 mcg patch q.72h., #10 without refills. 2. Continue oxycodone 5 mg one p.o. b.i.d. as needed for breakthrough pain,    #30 without refills. 3. Continue with homebased exercise program and working with her current    physical and massage therapists.  Recommend deep tissue massage to the    iliotibial bands with an aggressive stretching program.  Will consider    adding ultrasound versus local steroid injections. 4. The patient is to follow up in one month for reevaluation.  The patient was educated in the above findings and recommendations and understands.  No barriers to communication. Dictated by:   Sondra Come, D.O. Attending Physician:  Sondra Come DD:  01/28/02 TD:  01/30/02 Job: [id number]

## 2010-12-22 NOTE — Consult Note (Signed)
NAMECELEST, REITZ                          ACCOUNT NO.:  [id number]   MEDICAL RECORD NO.:  [id number]                   PATIENT TYPE:  REC   LOCATION:  [facility]                                  FACILITY:  [facility]   PHYSICIAN:  Zachary George, DO                      DATE OF BIRTH:  04-Sep-1950   DATE OF CONSULTATION:  09/24/2002  DATE OF DISCHARGE:                                   CONSULTATION   REASON FOR CONSULTATION:  The patient returns to clinic today for re-  evaluation.  She is mainly complaining of neck pain, pointing to the C7-T1  level, and states that it radiates across her right upper back and  occasionally to her right upper extremity.  Her pain today is a 6 to 7/10 on  a subjective scale.  She continues to have significant pain across her lower  back, and feels that the Duragesic 50 mcg does not seem to give as much  relief as it once had.  She is currently taking oxycodone 5 mg b.i.d.  consistently, where as previously she was only using it very sparingly.  She  is uncertain of whether or not this is somehow related to the change in  weather this winter.  I reviewed health and history form and 14 point review  of systems.  Function and quality of life indices remain stable.  Sleep is  good.  She continues also on Celebrex 200 mg b.i.d., and Neurontin 300 mg  t.i.d., as well as Elavil 25 mg at bedtime as needed.   PHYSICAL EXAMINATION:  GENERAL:  A healthy female in no acute distress.  VITAL SIGNS:  Blood pressure is 126/75, pulse 90, respirations 18, O2  saturation 99% on room air.  BACK:  There is an active trigger point in the right levator scapulae muscle  reproducing the patient's upper extremity symptoms and her upper back pain  and neck pain.  NEUROLOGIC:  Intact in the upper extremities, including motor, sensory, and  reflexes.  Spurling maneuver is negative bilaterally.  No new neurological  findings of the lower extremities today.   IMPRESSION:  1. 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  2. Chronic low back pain.  3. Degenerative disk disease of the lumbar spine, status post L4-5 and L5-S1     fusion with Ray cages and pedicle screws.  4. Left sacroiliac pain, status post left sacroiliac joint fusion.  5. Chronic left iliotibial band syndrome.   PLAN:  1. Repeat trigger point injection to the right levator scapulae muscle.  2. Continue Duragesic 50 mcg q.72h. #10 without refills.  If symptoms are     not improving, would consider switching to another long-acting opioid     analgesic such as MS Contin, Evinsa, or Kadian.  I do not feel that  escalation of the Duragesic dose is warranted at this time.  3. Discontinue oxycodone and begin Norco 10 mg/325 mg 1/2 to one p.o. t.i.d.     p.r.n. breakthrough pain #60 without refills.  We will monitor the     patient's usage pattern.  4. The patient is to return to clinic in one month for re-evaluation.   PROCEDURE:  Trigger point injection, right levator scapulae muscle.   DESCRIPTION OF PROCEDURE:  The procedure was described to the patient in  detail, including risks, benefits, limitations, and alternatives.  Risks  include, but are not limited to bleeding, infection, pneumothorax, nerve  injury, failure to relieve pain, increased pain, and allergic reaction to  medication.  The patient understands and wishes to proceed.  Informed  consent was obtained.  Skin was prepped in the usual fashion with alcohol  swabs.  The right levator scapulae muscle was injected with 1 cc of 0.25%  bupivacaine plus 1 cc of 1% lidocaine using a 25 gauge 1.5 inch needle.  Twitch responses were noted.  The patient tolerated the procedure well.  There were no complications.  Discharge instructions were given.  The  patient was released in stable condition.   The patient was educated on the above findings and recommendations and  understands.  There were no barriers to  communication.                                               Zachary George, DO    JW/MEDQ  D:  09/24/2002  T:  09/25/2002  Job:  [id number]

## 2010-12-22 NOTE — Consult Note (Signed)
[facility]  Patient:    Melody, Huerta Visit Number: [id number] MRN: [id number]          Service Type: PMG Location: [facility] Attending Physician:  Sondra Come Dictated by:   Sondra Come, D.O. Proc. Date: 10/09/01 Admit Date:  09/04/2001   CC:         Melody Huerta, M.D.   Consultation Report  Melody Huerta returns to clinic today as scheduled for reevaluation.  She was last seen on September 25, 2001 at which time I started her on Duragesic 25 mcg patch for chronic low back pain status post L4-5 and L5-S1 fusion.  The patient states that her symptoms are significantly improved with the Duragesic.  She continues to take oxycodone 5 mg b.i.d. as needed for breakthrough pain.  She notes that her function and quality of life indexes have significantly improved.  Her sleep is good.  In addition, she notes that the pain she was experiencing in her right upper back secondary to myofascial trigger point has resolved following a trigger point injection.  Her pain today is a 5/10 on a subjective scale.  The patient denies any new neurologic complaints.  Denies any radicular component at this time.  She continues on Neurontin 300 mg t.i.d.  She also continues on amitriptyline 25 mg at bedtime and Celebrex 200 mg b.i.d.  Overall, she is making considerable improvement.  PHYSICAL EXAMINATION  GENERAL:  Healthy female in no acute distress.  VITAL SIGNS:  Blood pressure 134/75, pulse 89, respirations 18, O2 saturation 100% on room air.  BACK:  Tenderness to palpation over the lumbosacral paraspinous region, especially on the left.  NEUROLOGIC:  Manual muscle testing is 5/5 bilateral lower extremities. Sensory examination is intact to light touch bilateral lower extremities. Muscle stretch reflexes are 2+/4 bilateral patellar, medial hamstrings and 1+/4 bilateral Achilles.  IMPRESSION: 1. Chronic low back pain with degenerative disk disease of the lumbar  spine    status post L4-5 and L5-S1 fusion with Ray cages and pedicle screws. 2. Status post left sacroiliac joint fusion. 3. Status post bilateral pyriformis release. 4. Status post right rotator cuff repair. 5. Resolved myofascial pain, right upper back. 6. Nonrestorative sleep disorder, improved.  PLAN: 1. Continue Duragesic 25 mcg one q.72h. #10 without refills. 2. Continue oxycodone 5 mg one p.o. b.i.d. p.r.n. for breakthrough pain #60    without refills. 3. Continue Celebrex 200 mg one p.o. b.i.d. #180 without refills. 4. Continue Neurontin 300 mg one p.o. t.i.d. #270 without refills. 5. Continue Elavil 25 mg one p.o. q.h.s. #90 without refills. 6. Patient to return to clinic in one month for reevaluation.  The patient was educated on the above findings and recommendations and understands.  There were no barriers to communication. Dictated by:   Sondra Come, D.O. Attending Physician:  Sondra Come DD:  10/09/01 TD:  10/10/01 Job: [id number]

## 2010-12-22 NOTE — Consult Note (Signed)
Melody Huerta, Melody Huerta                          ACCOUNT NO.:  [id number]   MEDICAL RECORD NO.:  [id number]                   PATIENT TYPE:  REC   LOCATION:  [facility]                                  FACILITY:  [facility]   PHYSICIAN:  Zachary George, DO                      DATE OF BIRTH:  June 29, 1951   DATE OF CONSULTATION:  07/24/2002  DATE OF DISCHARGE:                                   CONSULTATION   REASON FOR CONSULTATION:  The patient returns to clinic today for  reevaluation.  She continues to have complaints of lower back pain involving  her lumbar paraspinous region as well as the sacroiliac joint region.  She  also has persistent left lateral thigh pain secondary to iliotibial band  syndrome, which has been chronic.  She states she has had a busy holiday  season and has not been going her aquatic therapy in approximately a month  and I suspect that her pain has increased secondary to her decreasing her  exercise program as well as the stress of the holidays.  Her pain today is a  5/10 on a subjective scale.  She continues to use the Duragesic 50 mcg patch  q.72h..  She also uses oxycodone 5 mg for breakthrough pain and has been  using it very sparingly; in fact, she has not used up her entire  prescription of 50 pills which were prescribed back in October.  She also  continues on amitriptyline 25 mg at bedtime, Neurontin 300 mg t.i.d. and  Celebrex 200 mg b.i.d.  She requests refills on the Duragesic, oxycodone and  Celebrex today.  She denies any new neurologic complaints, although she does  complain of recurrence of her right upper back pain.  She underwent a  trigger point injection in February of 2003 for trigger points in the right  upper trapezius and levator scapulae.  She states that her symptoms now are  similar to those that she experienced previously.  She had a great result  with the trigger point injections.  I reviewed the health and history form  and 14-point review of  systems.   PHYSICAL EXAMINATION:  GENERAL:  Physical examination reveals a healthy  female in no acute distress.  VITAL SIGNS:  Blood pressure 128/46, pulse 88, respirations 16, O2  saturation 98% on room air.  NEUROMUSCULAR:  Manual muscle testing is 5/5, bilateral upper and lower  extremities.  Sensory examination is intact to light touch, bilateral upper  and lower extremities.  Muscle stretch reflexes are symmetric, bilateral  upper and lower extremities.  There is tenderness to palpation in the lumbar  paraspinous region.  There was also tenderness to palpation along the  iliotibial band on the left.  There is an active trigger point noted in the  right levator scapulae muscle.  There is mild tenderness to palpation in the  upper trapezius on the right but without an active trigger point.   IMPRESSION:  1. Myofascial pain syndrome with above-noted trigger point.  2. Chronic low back pain.  3. Degenerative disk disease of the lumbar spine, status post L4-5 and L5-S1     fusion with Ray cages and pedical screws.  4. Left sacroiliac pain, status post left sacroiliac joint fusion.  5. Chronic left iliotibial band syndrome.   PLAN:  1. Discussed further treatment options with the patient in terms of her     current pain symptoms.  I would like her to get back into aquatic therapy     after the holidays are over.  I think the resumption of an exercise     program will bring her pain back to previous levels.  I do not think it     is warranted at this point to escalate her narcotic-based pain     medication.  In fact, we will keep the Duragesic the same at 50 mcg     q.72h. and I have refilled this for one month.  2. Continue with oxycodone 5 mg one p.o. b.i.d. as needed for breakthrough     pain, #50 without refills.  The patient has been using this appropriately     and sparingly.  3. Continue Celebrex 200 mg one p.o. b.i.d., #180 with 1 refill.  4. Continue Neurontin and Elavil  for which she has prescriptions.  5. In terms of patient's myofascial upper back pain, will perform a trigger     point injection to the right levator scapulae muscle.  6. Patient to return to clinic in three months or sooner as needed.  Would     consider repeating trigger point injection if symptoms do not entirely     resolve.   PROCEDURE:  Trigger point injection.   INFORMED CONSENT:  Procedure was described to patient in detail including  risks, benefits, limitations and alternatives.  She wishes to proceed.  Informed consent was obtained.   DESCRIPTION OF PROCEDURE:  Skin was prepped with alcohol swabs.  The right  levator scapulae trigger point was injected with 0.5 cc of 1% lidocaine plus  0.5 cc of 0.25% bupivacaine using a 25-gauge 1-1/2-inch needle.  The patient  tolerated the procedure well.   Discharge instructions given.  No complications noted.   Patient was educated in the above findings and recommendations and  understands.  No barriers to communication.                                               Zachary George, DO    JW/MEDQ  D:  07/24/2002  T:  07/25/2002  Job:  [id number]

## 2010-12-22 NOTE — Consult Note (Signed)
[facility]  Patient:    LIVIAH, CAKE Visit Number: [id number] MRN: [id number]          Service Type: Attending:  Sondra Come, D.O. Dictated by:   Sondra Come, D.O. Proc. Date: 02/25/02                            Consultation Report  Ms. Marsalis returns to clinic today as scheduled for reevaluation.  She was last seen on January 28, 2002.  She brings her used patches and pills with her for appropriate pill count.  She states she has been more active over the past several weeks and feels like this is contributing to increased pain over the past few weeks.  Her pain today is a 6/10 on a subjective scale.  I review health and history form and 14 point review of systems.  Function and quality of life indexes remain essentially the same.  Denies any new neurologic complaints.  PHYSICAL EXAMINATION  GENERAL:  Healthy female in no acute distress.  VITAL SIGNS:  Blood pressure 115/70, pulse 81, respirations 16, O2 saturation 97% on room air.  NEUROLOGIC:  Manual muscle testing is 5/5 bilateral lower extremities. Sensory examination is intact to light touch bilateral lower extremities. Muscle stretch reflexes are 2+/4 bilateral patellar and medial hamstrings and Achilles today.  Palpatory examination of the left lateral thigh reveals significant tenderness, especially over the ITB at the knee.  IMPRESSION: 1. Chronic low back pain with degenerative disk disease of the lumbar spine    status post L4-5 and L5-S1 fusion with ray cages and pedicle screws. 2. Left iliotibial band syndrome. 3. Status post left sacroiliac joint fusion. 4. Status post bilateral pyriformis release.  PLAN: 1. Continue Duragesic 50 mcg patch q.72h. #10 without refills. 2. Continue oxycodone 5 mg one p.o. b.i.d. as needed for breakthrough pain #50    without refills. 3. Continue with massage therapy for deep friction massage to the iliotibial    band with myofascial release.   Consider local steroid injection if symptoms    are not improving. 4. Continue home based exercise program. 5. Continue Celebrex and Neurontin as well as Elavil. 6. Discussed activity modification. 7. Will begin Lidoderm 5% patches to use up to 12 hours per day on lower back    #30 with one refill. 8. The patient is to return to clinic in five weeks for reevaluation as she    has enough medications to last her until that time.  The patient was educated on the above findings and recommendations and understands.  No barriers to communication. Dictated by:   Sondra Come, D.O. Attending:  Sondra Come, D.O. DD:  02/25/02 TD:  03/01/02 Job: [id number]

## 2010-12-22 NOTE — Consult Note (Signed)
[facility]  Patient:    Melody Huerta, Melody Huerta Visit Number: [id number] MRN: [id number]          Service Type: PMG Location: [facility] Attending Physician:  Sondra Come Dictated by:   Sondra Come, D.O. Admit Date:  09/04/2001   CC:         Javier Docker, M.D.   Consultation Report  HISTORY OF PRESENT ILLNESS:  The patient returns to the clinic today as scheduled for reevaluation. Since I saw her last on September 11, 2001, the patient states she is doing significantly better in terms of her lower extremity radicular symptoms with the addition of Neurontin 300 mg 3 times daily, Elavil 25 mg a bedtime for sleep restoration, and Celebrex 200 mg p.o. bid. She continues to take OxyContin 20 mg b.i.d. and again expresses a desire to come off of OxyContin and has even tried skipping her evening dose but she states that she has had increased pain when she wakes up in the morning. She has a prescription for oxycodone 5 mg to take for breakthrough pain, but she has not had to take this. She also continues to take glucosamine and chondroitin. Her main complaint today is pain in her right upper back, pointing to her upper trapezius and levator scapulae region. She states that this has occasionally caused intermittent paresthesias in her right upper extremity when she is typing on a computer which results with rest. Her pain today is a 7/10 on a subjective scale. Function and quality of life indices have improved. Sleep is great. I review health and history form and 14-point review of systems.  PHYSICAL EXAMINATION:  GENERAL:  A healthy female in no acute distress.  VITAL SIGNS:  Blood pressure 130/77, pulse 85, respirations 10, O2 saturation 100% on room air.  EXTREMITIES:  Palpatory examination reveals trigger points in the right upper trapezius and levator scapulae. Manual muscle testing is 5/5 bilateral upper and lower extremities. Sensory examination is  intact to light touch bilateral upper and lower extremities at this time. Muscle stretch reflexes are 2+/4 bilateral biceps, triceps, brachioradialis, and pronator teres, bilateral patellar, medial hamstrings, and 1+/4 bilateral Achilles.  IMPRESSION: 1. Myofascial pain right upper back with noted trigger points in the    upper trapezius and levator scapulae. 2. Degenerative disk disease of the lumbar spine status post L4-5    and L5-S1 fusion with ray cages and pedicle screws. Improved    radicular component. 3. Status post left sacroiliac joint fusion. 4. Status post bilateral piriformis release. 5. Status post right rotator cuff repair. 6. Nonrestorative sleep disorder, significantly improved.  PLAN:  Trigger-point injections to the right levator scapulae and upper trapezius.  DESCRIPTION OF PROCEDURE:  Procedure was described to the patient in detail including risks, benefits, limitations, and alternatives. The patient gave informed consent. Skin was prepped and draped in the usual sterile fashion. Each trigger points was injected with 0.5 cc of 1% lidocaine plus 0.5 cc of 0.25% bupivacaine. No complications. The patient tolerated the procedure well. Discharge instructions given.  RECOMMENDATIONS: 1. Will transition the patient from oxycontin to Duragesic patch 25    mcg 1 q.72h. #5 without refills. The patient to continue oxycodone    for breakthrough pain as needed. 2. The patient to continue Neurontin, amitriptyline, Zoloft, Celebrex,    and glucosamine and chondroitin. 3. The patient is to return to clinic in 2 weeks for reevaluation.    Consider repeat trigger-point injections. We will also rediscuss  medications.  The patient was educated on the above findings and recommendations and understands. There were no barriers to communication. Dictated by:   Sondra Come, D.O. Attending Physician:  Sondra Come DD:  09/25/01 TD:  09/26/01 Job: [id number]

## 2010-12-22 NOTE — Op Note (Signed)
[facility]  Patient:    Melody Huerta, Melody Huerta Visit Number: [id number] MRN: [id number]          Service Type: SUR Location: 4W 0473 01 Attending Physician:  Pierce Crane Proc. Date: 05/28/01 Admit Date:  05/28/2001                             Operative Report  PREOPERATIVE DIAGNOSES:  Rotator cuff tear, distal clavicle resection.  POSTOPERATIVE DIAGNOSES:  Rotator cuff tear, distal clavicle resection, adhesive capsulitis.  PROCEDURE PERFORMED:  Exam under anesthesia, manipulation of the right shoulder, open rotator cuff repair, distal clavicle resection, acromioplasty.  BRIEF HISTORY AND INDICATION:  This is a 60 year old female with a history of upper extremity pain with a cervical spondylosis, developed adhesive capsulitis and MRI indicating rotator cuff tear of the supraspinatus.  The patient also had symptomatic AC arthrosis with a spur in the subacromial region.  The patient had a period of adhesive capsulitis improved with physical therapy but persisted, MRI indicating tear, full-thickness in the rotator cuff, lateral sloping acromion, spur off the undersurface of the clavicle, all impinging.  Operative intervention was indicated for decompression, rotator cuff repair and distal clavicle resection.  Exam under anesthesia, risks and benefits discussed, including bleeding, infection, damage to vascular structures, no change in symptoms, need for revision, suboptimal range of motion, protracted recovery, etc.  TECHNIQUE:  The patient supine in the beach chair position.  After induction of adequate general anesthesia, 1 g Kefzol, the right shoulder and upper extremity was prepped and draped in the usual sterile fashion.  Surgical marker was utilized to delineate the acromion, distal clavicle, coracoid. Noted on the examination, the patient had abduction to 60 degrees only, external rotation at 10 degrees.  With gentle manipulation under  anesthesia, appreciation of breaking of adhesive capsulitis and received full abduction and forward flexion, internal and external rotation.  This was without any difficulty.  Following this, an incision was made in Langers lines over the anterior aspect of the acromion.  Subcutaneous tissue was dissected. Electrocautery was utilized to achieve hemostasis.  Marcaine 0.25% with epinephrine was infiltrated into the tissues.  Next, the incision was made over the [facility] joint through the capsule into the raphe between the anterior and lateral heads of the deltoid.  The capsule over the distal clavicle was subperiosteally elevated, and the distal clavicle was skeletonized as was the acromion side of the [facility] joint.  Large spur off the anteromedial aspect of the acromion and off the inferior aspect of the clavicle was noted with retractors protecting the undersurface of the clavicle and anteriorly and posteriorly, an oscillating saw utilized to remove approximately 8 mm of the distal clavicle. Following this, the undersurface still presented an inferiorly projecting osteophyte into the rotator cuff.  This was removed with a 3 mm Kerrison.  No residual impingement noted.  No tear noted at the site.  The wound copiously irrigated.  The spur off the acromion was removed with a Theatre manager.  Next the deltoid was subperiosteally elevated from the anterolateral aspect of the acromion.  The CA ligament was detached and excised.  There was a hook over the anterior and lateral aspect of the acromion, projecting into the anterolateral aspect into the rotator cuff.  Oscillating saw utilized to perform an acromioplasty.  Approximately 4 mm were excised from its inferior surface.  There was good thickness.  There was residual acromion noted; a small  portion 3 mm anteriorly was excised colinear with the clavicle.  This removed the impingement.  Following this, there was good range.  There was significant  hypertrophic bursa, and this was excised.  A small full-thickness tear noted in the anterolateral and at the edge of the supraspinatus, coinciding with that seen on the MRI.  This was excised.  There was good bleeding tissue approximately 1.5 cm in length.  Beneath this, the bone was roughened.  A good bleeding tissue, and this was repaired with #1 Vicryl in interrupted figure-of-eight sutures.  Good range of motion without tension on the repair site.  No need for suture anchors.  Good range of motion without impingement.  The wound copiously irrigated.  Next the raphe repaired with #1 Vicryl interrupted figure-of-eight sutures as was the capsule over the [facility] joint and the deltoid trapezial fascia.  This was after copious irrigation. The raphe and the deltoid was repaired over the acromion with excellent watertight repair noted.  No tension on the wound site noted.  Good range of motion following this without disruption of the repair site.  Wound copiously irrigated.  Next the tissue was reapproximated with 2-0 Vicryl simple suture in the subcutaneous region.  Subcuticular region reapproximated with 4-0 subcuticular Prolene.  The wound reinforced with Steri-Strips.  Sterile dressing applied.  Abduction pillow placed.  The patient was extubated without difficulty and transported to the recovery room in satisfactory condition.  The patient tolerated the procedure well with no known complication.  The patients neck was placed in the neutral position in the holder at all times. No significant traction needed for the upper extremity. Attending Physician:  Pierce Crane DD:  05/28/01 TD:  05/28/01 Job: [id number]

## 2010-12-22 NOTE — Letter (Signed)
November 04, 2006    Versie Starks   RE:  Melody, Huerta  MRN:  [id number]  /  DOB:  Jan 17, 1951   Dear [name]:   It is my pleasure to have treated you recently as a new patient in my  office.  I appreciate your confidence and the opportunity to participate  in your care.   Since I do have a busy inpatient endoscopy schedule and office schedule,  my office hours vary weekly.  I am, however, available for emergency  calls every day through my office.  If I cannot promptly meet an urgent  office appointment, another one of our gastroenterologists will be [place]  to assist you.   My well-trained staff are prepared to help you at all times.  For  emergencies after office hours, a physician from our gastroenterology  section is always available through my 24-hour answering service.   While you are under my care, I encourage discussion of your questions  and concerns, and I will be happy to return your calls as soon as I am  available.   Once again, I welcome you as a new patient and I look forward to a happy  and healthy relationship.    Sincerely,      Barbette Hair. Arlyce Dice, MD,FACG  Electronically Signed   RDK/MedQ  DD: 11/04/2006  DT: 11/04/2006  Job #: [id number]

## 2011-01-29 ENCOUNTER — Encounter: Payer: Self-pay | Admitting: Cardiology

## 2011-03-21 ENCOUNTER — Other Ambulatory Visit (HOSPITAL_COMMUNITY): Payer: Self-pay | Admitting: Neurosurgery

## 2011-03-21 DIAGNOSIS — M47812 Spondylosis without myelopathy or radiculopathy, cervical region: Secondary | ICD-10-CM

## 2011-04-17 ENCOUNTER — Other Ambulatory Visit (HOSPITAL_COMMUNITY): Payer: Self-pay | Admitting: Neurosurgery

## 2011-04-17 ENCOUNTER — Ambulatory Visit (HOSPITAL_COMMUNITY)
Admission: RE | Admit: 2011-04-17 | Discharge: 2011-04-17 | Disposition: A | Discharge status: Home / Self Care | Payer: BC Managed Care – PPO | Source: Ambulatory Visit | Attending: Neurosurgery | Admitting: Neurosurgery

## 2011-04-17 DIAGNOSIS — R51 Headache: Secondary | ICD-10-CM | POA: Insufficient documentation

## 2011-04-17 DIAGNOSIS — M47812 Spondylosis without myelopathy or radiculopathy, cervical region: Secondary | ICD-10-CM | POA: Insufficient documentation

## 2011-04-17 DIAGNOSIS — Z981 Arthrodesis status: Secondary | ICD-10-CM | POA: Insufficient documentation

## 2011-04-17 DIAGNOSIS — M549 Dorsalgia, unspecified: Secondary | ICD-10-CM

## 2011-04-17 MED ORDER — IOHEXOL 300 MG/ML  SOLN
10.0000 mL | Freq: Once | INTRAMUSCULAR | Status: AC | PRN
  Administered 2011-04-17: 10 mL via INTRATHECAL

## 2012-02-22 ENCOUNTER — Other Ambulatory Visit: Payer: Self-pay | Admitting: Otolaryngology

## 2012-02-22 ENCOUNTER — Ambulatory Visit
Admission: RE | Admit: 2012-02-22 | Discharge: 2012-02-22 | Disposition: A | Discharge status: Home / Self Care | Payer: BC Managed Care – PPO | Source: Ambulatory Visit | Attending: Otolaryngology | Admitting: Otolaryngology

## 2012-02-22 DIAGNOSIS — J329 Chronic sinusitis, unspecified: Secondary | ICD-10-CM

## 2012-10-30 ENCOUNTER — Other Ambulatory Visit: Payer: Self-pay

## 2012-10-30 DIAGNOSIS — Z1231 Encounter for screening mammogram for malignant neoplasm of breast: Secondary | ICD-10-CM

## 2013-01-02 ENCOUNTER — Ambulatory Visit: Payer: BC Managed Care – PPO

## 2013-01-06 ENCOUNTER — Ambulatory Visit: Payer: BC Managed Care – PPO

## 2013-01-07 ENCOUNTER — Ambulatory Visit
Admission: RE | Admit: 2013-01-07 | Discharge: 2013-01-07 | Disposition: A | Discharge status: Home / Self Care | Payer: BC Managed Care – PPO | Source: Ambulatory Visit

## 2013-01-07 DIAGNOSIS — Z1231 Encounter for screening mammogram for malignant neoplasm of breast: Secondary | ICD-10-CM

## 2013-01-12 ENCOUNTER — Other Ambulatory Visit: Payer: Self-pay | Admitting: Obstetrics and Gynecology

## 2013-01-12 DIAGNOSIS — R928 Other abnormal and inconclusive findings on diagnostic imaging of breast: Secondary | ICD-10-CM

## 2013-01-26 ENCOUNTER — Ambulatory Visit
Admission: RE | Admit: 2013-01-26 | Discharge: 2013-01-26 | Disposition: A | Discharge status: Home / Self Care | Payer: BC Managed Care – PPO | Source: Ambulatory Visit | Attending: Obstetrics and Gynecology | Admitting: Obstetrics and Gynecology

## 2013-01-26 DIAGNOSIS — R928 Other abnormal and inconclusive findings on diagnostic imaging of breast: Secondary | ICD-10-CM

## 2013-02-04 ENCOUNTER — Other Ambulatory Visit: Payer: Self-pay | Admitting: Anesthesiology

## 2013-02-04 DIAGNOSIS — M542 Cervicalgia: Secondary | ICD-10-CM

## 2013-02-10 ENCOUNTER — Ambulatory Visit
Admission: RE | Admit: 2013-02-10 | Discharge: 2013-02-10 | Disposition: A | Discharge status: Home / Self Care | Payer: BC Managed Care – PPO | Source: Ambulatory Visit | Attending: Anesthesiology | Admitting: Anesthesiology

## 2013-02-10 DIAGNOSIS — M542 Cervicalgia: Secondary | ICD-10-CM

## 2013-02-10 MED ORDER — GADOBENATE DIMEGLUMINE 529 MG/ML IV SOLN
17.0000 mL | Freq: Once | INTRAVENOUS | Status: AC | PRN
  Administered 2013-02-10: 17 mL via INTRAVENOUS

## 2013-06-15 DIAGNOSIS — G43709 Chronic migraine without aura, not intractable, without status migrainosus: Secondary | ICD-10-CM | POA: Insufficient documentation

## 2013-06-15 DIAGNOSIS — G8929 Other chronic pain: Secondary | ICD-10-CM | POA: Insufficient documentation

## 2014-03-02 ENCOUNTER — Encounter: Payer: Self-pay | Admitting: Gastroenterology

## 2014-03-02 ENCOUNTER — Ambulatory Visit (INDEPENDENT_AMBULATORY_CARE_PROVIDER_SITE_OTHER): Payer: BC Managed Care – PPO | Admitting: Gastroenterology

## 2014-03-02 VITALS — BP 126/80 | HR 72 | Ht 67.75 in | Wt 192.2 lb

## 2014-03-02 DIAGNOSIS — R1032 Left lower quadrant pain: Secondary | ICD-10-CM

## 2014-03-02 MED ORDER — HYOSCYAMINE SULFATE 0.125 MG SL SUBL: 0.2500 mg | SUBLINGUAL_TABLET | SUBLINGUAL | Status: DC | PRN

## 2014-03-02 MED ORDER — PEG-KCL-NACL-NASULF-NA ASC-C 100 G PO SOLR: 1.0000 | Freq: Once | ORAL | Status: DC

## 2014-03-02 NOTE — Addendum Note (Signed)
Addended by: Marlowe KaysSTALLINGS, Deliana Avalos M on: 03/02/2014 04:01 PM   Modules accepted: Orders

## 2014-03-02 NOTE — Addendum Note (Signed)
Addended by: Marlowe KaysSTALLINGS, Sullivan Jacuinde M on: 03/02/2014 04:00 PM   Modules accepted: Orders

## 2014-03-02 NOTE — Addendum Note (Signed)
Addended by: Marlowe KaysSTALLINGS, Malaisha Silliman M on: 03/02/2014 03:58 PM   Modules accepted: Orders

## 2014-03-02 NOTE — Patient Instructions (Addendum)
You have been scheduled for a colonoscopy. Please follow written instructions given to you at your visit today.  Please pick up your prep kit at the pharmacy within the next 1-3 days. If you use inhalers (even only as needed), please bring them with you on the day of your procedure. Your physician has requested that you go to www.startemmi.com and enter the access code given to you at your visit today. This web site gives a general overview about your procedure. However, you should still follow specific instructions given to you by our office regarding your preparation for the procedure  Redone instructions for MoviPrep because of an allergy in [place] [employer] coupon given for MoviPrep

## 2014-03-02 NOTE — Progress Notes (Signed)
_                                                                                                                History of Present Illness:  Ms. Melody Huerta is a pleasant 63 year old white female referred for evaluation of abdominal pain.  For the past 3-4 months she's been suffering from sharp intermittent left lower quadrant pain.  This is often followed by diarrhea.  Pain may last 5-10 minutes And is described as knifelike.  She denies rectal bleeding.  She apparently underwent CT scan at urology which did not see any abnormalities.  Shows underwent GYN exam which also was normal.  She tends to be constipated but takes oxycodone regularly for chronic back pain.  She was given movantik but stopped after 2 or 3 days because of severe diarrhea.  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  She is without fever, nausea or vomiting.   Past Medical History  Diagnosis Date  . HLD (hyperlipidemia)   . Gallstones   . Migraines   . Arthritis   . Depression   . Sleep apnea     on CPAP  . Chronic pain disorder    Past Surgical History  Procedure Laterality Date  . Left shoulder arthroscopy Left 09/08/08    Leandra KernJeffery Beane MD, frozen shoulder  . L4,l5,s1,s2 fusion  1998  . S1 fusion  2000 and 2001  . 2nd lumbar fusion  2001  . Rotator cuff repair Right 2002  . Cholecystectomy    . Abdominal hysterectomy     family history includes Alzheimer's disease in her mother; COPD in her father; Diabetes in her maternal grandfather, paternal grandfather, and paternal grandmother; Heart disease in her father; Migraines in her mother; Stroke in her father. Current Outpatient Prescriptions  Medication Sig Dispense Refill  . AMBULATORY NON FORMULARY MEDICATION CPAP      . Butalbital-APAP-Caffeine (FIORICET) 50-300-40 MG CAPS Take 1 tablet by mouth as needed.      . celecoxib (CELEBREX) 200 MG capsule Take 200 mg by mouth daily.        Marland Kitchen. escitalopram (LEXAPRO) 10 MG tablet Take 10 mg by mouth 2 (two)  times daily.      Marland Kitchen. ipratropium (ATROVENT) 0.06 % nasal spray Place 2 sprays into both nostrils as needed for rhinitis.      Marland Kitchen. L-Methylfolate-B6-B12 (METANX) 3-35-2 MG TABS Take 1 tablet by mouth 2 (two) times daily.        . methocarbamol (ROBAXIN) 500 MG tablet Take 500 mg by mouth every 8 (eight) hours as needed for muscle spasms.      Marland Kitchen. oxyCODONE (ROXICODONE) 15 MG immediate release tablet Take 15 mg by mouth every 6 (six) hours as needed for pain.      Marland Kitchen. oxymorphone (OPANA ER) 40 MG 12 hr tablet Take 40 mg by mouth every 12 (twelve) hours.        Marland Kitchen. zolpidem (AMBIEN CR) 12.5 MG CR tablet Take 12.5 mg by mouth at bedtime as needed for  sleep.       No current facility-administered medications for this visit.   Allergies as of 03/02/2014 - Review Complete 03/02/2014  Allergen Reaction Noted  . Bactrim [sulfamethoxazole-tmp ds]  03/02/2014  . Penicillins Rash     reports that she has never smoked. She has never used smokeless tobacco. She reports that she does not drink alcohol or use illicit drugs.   Review of Systems: She has chronic low back pain.  Pertinent positive and negative review of systems were noted in the above HPI section. All other review of systems were otherwise negative.  Vital signs were reviewed in today's medical record Physical Exam: General: Well developed , well nourished, no acute distress Skin: anicteric Head: Normocephalic and atraumatic Eyes:  sclerae anicteric, EOMI Ears: Normal auditory acuity Mouth: No deformity or lesions Neck: Supple, no masses or thyromegaly Lungs: Clear throughout to auscultation Heart: Regular rate and rhythm; no murmurs, rubs or bruits Abdomen: Soft,  and non distended. No masses, hepatosplenomegaly or hernias noted. Normal Bowel sounds.  She is mild tenderness in the left lower quadrant and lower midline without guarding or rebound Rectal:deferred Musculoskeletal: Symmetrical with no gross deformities  Skin: No lesions on  visible extremities Pulses:  Normal pulses noted Extremities: No clubbing, cyanosis, edema or deformities noted Neurological: Alert oriented x 4, grossly nonfocal Cervical Nodes:  No significant cervical adenopathy Inguinal Nodes: No significant inguinal adenopathy Psychological:  Alert and cooperative. Normal mood and affect  See Assessment and Plan under Problem List

## 2014-03-02 NOTE — Assessment & Plan Note (Signed)
4 month history of intermittent sharp left lower quadrant pain followed by diarrhea.  Patient is chronically constipated and often takes MiraLax.  Constipation is most likely narcotic-related.  Colonoscopy in 2008  demonstrated melanosis.  I suspect that pain is due to spasm as result of chronic constipation.  A structural abnormality of the colon including stricturing from diverticular disease or neoplasm should be ruled out.  Recommendations #1 hyomax sublingual when necessary #2 colonoscopy; if no abnormalities are seen I will work on a bowel regimen with her

## 2014-03-04 ENCOUNTER — Encounter: Payer: Self-pay | Admitting: Gastroenterology

## 2014-04-23 ENCOUNTER — Encounter: Payer: BC Managed Care – PPO | Admitting: Gastroenterology

## 2014-04-26 ENCOUNTER — Other Ambulatory Visit: Payer: Self-pay | Admitting: Obstetrics and Gynecology

## 2014-04-26 DIAGNOSIS — N63 Unspecified lump in unspecified breast: Secondary | ICD-10-CM

## 2014-04-29 ENCOUNTER — Ambulatory Visit
Admission: RE | Admit: 2014-04-29 | Discharge: 2014-04-29 | Disposition: A | Discharge status: Home / Self Care | Payer: BC Managed Care – PPO | Source: Ambulatory Visit | Attending: Obstetrics and Gynecology | Admitting: Obstetrics and Gynecology

## 2014-04-29 ENCOUNTER — Encounter (INDEPENDENT_AMBULATORY_CARE_PROVIDER_SITE_OTHER): Payer: Self-pay

## 2014-04-29 DIAGNOSIS — N63 Unspecified lump in unspecified breast: Secondary | ICD-10-CM

## 2014-05-07 ENCOUNTER — Ambulatory Visit (AMBULATORY_SURGERY_CENTER): Payer: BC Managed Care – PPO | Admitting: Gastroenterology

## 2014-05-07 ENCOUNTER — Encounter: Payer: Self-pay | Admitting: Gastroenterology

## 2014-05-07 VITALS — BP 100/69 | HR 64 | Temp 97.9°F | Resp 12 | Ht 67.25 in | Wt 192.0 lb

## 2014-05-07 DIAGNOSIS — R1032 Left lower quadrant pain: Secondary | ICD-10-CM

## 2014-05-07 DIAGNOSIS — K5900 Constipation, unspecified: Secondary | ICD-10-CM

## 2014-05-07 MED ORDER — LINACLOTIDE 145 MCG PO CAPS: 145.0000 ug | ORAL_CAPSULE | Freq: Every day | ORAL | Status: DC

## 2014-05-07 MED ORDER — SODIUM CHLORIDE 0.9 % IV SOLN: 500.0000 mL | INTRAVENOUS | Status: DC

## 2014-05-07 NOTE — Progress Notes (Signed)
A/ox3 pleased with MAC, report to Jane RN 

## 2014-05-07 NOTE — Patient Instructions (Signed)

## 2014-05-07 NOTE — Op Note (Signed)
[facility] [address] [place], [id number]   COLONOSCOPY PROCEDURE REPORT  PATIENT: Melody Huerta, Melody Huerta  MR#: [id number] BIRTHDATE: 06-01-1951 , 62  yrs. old GENDER: female ENDOSCOPIST: Louis Meckelobert D Kaplan, MD REFERRED BY: PROCEDURE DATE:  05/07/2014 PROCEDURE:   Colonoscopy, diagnostic First Screening Colonoscopy - Avg.  risk and is 50 yrs.  old or older - No.  Prior Negative Screening - Now for repeat screening. Other: See Comments  History of Adenoma - Now for follow-up colonoscopy & has been > or = to 3 yrs.  N/A  Polyps Removed Today? No.  Recommend repeat exam, <10 yrs? No. ASA CLASS:   Class II INDICATIONS:abdominal pain in the lower left quadrant and constipation. MEDICATIONS: Monitored anesthesia care and Propofol 200 mg IV  DESCRIPTION OF PROCEDURE:   After the risks benefits and alternatives of the procedure were thoroughly explained, informed consent was obtained.  The digital rectal exam revealed no abnormalities of the rectum.   The LB [id number]  endoscope was introduced through the anus and advanced to the cecum, which was identified by both the appendix and ileocecal valve. No adverse events experienced.   The quality of the prep was Suprep adequate The instrument was then slowly withdrawn as the colon was fully examined.      COLON FINDINGS: A normal appearing cecum, ileocecal valve, and appendiceal orifice were identified.  The ascending, transverse, descending, sigmoid colon, and rectum appeared unremarkable. Retroflexed views revealed no abnormalities. The time to cecum=5 minutes 02 seconds.  Withdrawal time=6 minutes 10 seconds.  The scope was withdrawn and the procedure completed. COMPLICATIONS: There were no immediate complications.  ENDOSCOPIC IMPRESSION: Normal colonoscopy  ABdominal pain likely related to obstipation/constipation  RECOMMENDATIONS: Trial of Linzess 145ug qd OV 4-5 weeks f/u colonosclopy 10  years  eSigned:  Louis Meckelobert D Kaplan, MD 05/07/2014 2:42 PM   cc: Silvestre MomentMyers, Stephen MD   PATIENT NAME:  Melody Huerta, Melody Huerta MR#: [id number]

## 2014-05-10 ENCOUNTER — Telehealth: Payer: Self-pay | Admitting: *Deleted

## 2014-05-10 NOTE — Telephone Encounter (Signed)
  Follow up Call-  Call back number 05/07/2014  Post procedure Call Back phone  # [phone]  Permission to leave phone message Yes     Patient questions:  Do you have a fever, pain , or abdominal swelling? No. Pain Score  0 *  Have you tolerated food without any problems? Yes.    Have you been Sorlie to return to your normal activities? Yes.    Do you have any questions about your discharge instructions: Diet   No. Medications  No. Follow up visit  No.  Do you have questions or concerns about your Care? No.  Actions: * If pain score is 4 or above: No action needed, pain <4.

## 2014-06-01 ENCOUNTER — Other Ambulatory Visit: Payer: Self-pay

## 2014-06-01 ENCOUNTER — Telehealth: Payer: Self-pay | Admitting: Gastroenterology

## 2014-06-01 MED ORDER — LINACLOTIDE 145 MCG PO CAPS: 145.0000 ug | ORAL_CAPSULE | Freq: Every day | ORAL | Status: DC

## 2014-06-01 NOTE — Telephone Encounter (Signed)
She has hyomax already prescribed.  She can take this with the onset of any abdominal discomfort. Continue Linzess daily

## 2014-06-01 NOTE — Telephone Encounter (Signed)
advised

## 2014-06-01 NOTE — Telephone Encounter (Signed)
She has normal bowel movements if she takes the Linzess. The Rx was for 15 days instead of 30, but the Sig says daily. I escribed a new rx to [facility] for a 30 day supply. Despite good bm's she will still get waves of abdominal cramps sometimes about an hour after eating.

## 2014-06-23 ENCOUNTER — Other Ambulatory Visit (HOSPITAL_COMMUNITY): Payer: Self-pay | Admitting: Anesthesiology

## 2014-06-23 DIAGNOSIS — M47816 Spondylosis without myelopathy or radiculopathy, lumbar region: Secondary | ICD-10-CM

## 2014-06-23 DIAGNOSIS — M47812 Spondylosis without myelopathy or radiculopathy, cervical region: Secondary | ICD-10-CM

## 2014-06-23 DIAGNOSIS — M961 Postlaminectomy syndrome, not elsewhere classified: Secondary | ICD-10-CM

## 2014-06-24 ENCOUNTER — Ambulatory Visit: Payer: BC Managed Care – PPO | Admitting: Gastroenterology

## 2014-06-28 ENCOUNTER — Encounter (HOSPITAL_COMMUNITY)
Admission: RE | Admit: 2014-06-28 | Discharge: 2014-06-28 | Disposition: A | Discharge status: Home / Self Care | Payer: BC Managed Care – PPO | Source: Ambulatory Visit | Attending: Anesthesiology | Admitting: Anesthesiology

## 2014-06-28 DIAGNOSIS — M961 Postlaminectomy syndrome, not elsewhere classified: Secondary | ICD-10-CM

## 2014-06-28 DIAGNOSIS — M47816 Spondylosis without myelopathy or radiculopathy, lumbar region: Secondary | ICD-10-CM | POA: Diagnosis present

## 2014-06-28 DIAGNOSIS — M47812 Spondylosis without myelopathy or radiculopathy, cervical region: Secondary | ICD-10-CM | POA: Diagnosis present

## 2014-06-28 MED ORDER — TECHNETIUM TC 99M MEDRONATE IV KIT
27.4000 | PACK | Freq: Once | INTRAVENOUS | Status: AC | PRN
  Administered 2014-06-28: 27.4 via INTRAVENOUS

## 2014-11-26 ENCOUNTER — Other Ambulatory Visit: Payer: Self-pay | Admitting: Anesthesiology

## 2014-11-26 DIAGNOSIS — M542 Cervicalgia: Secondary | ICD-10-CM

## 2015-08-29 ENCOUNTER — Other Ambulatory Visit: Payer: Self-pay | Admitting: Neurosurgery

## 2015-09-07 ENCOUNTER — Encounter (HOSPITAL_COMMUNITY)
Admission: RE | Admit: 2015-09-07 | Discharge: 2015-09-07 | Disposition: A | Discharge status: Home / Self Care | Payer: BLUE CROSS/BLUE SHIELD | Source: Ambulatory Visit | Attending: Neurosurgery | Admitting: Neurosurgery

## 2015-09-07 ENCOUNTER — Encounter (HOSPITAL_COMMUNITY): Payer: Self-pay

## 2015-09-07 DIAGNOSIS — D72829 Elevated white blood cell count, unspecified: Secondary | ICD-10-CM | POA: Insufficient documentation

## 2015-09-07 DIAGNOSIS — R509 Fever, unspecified: Secondary | ICD-10-CM | POA: Insufficient documentation

## 2015-09-07 DIAGNOSIS — Z01818 Encounter for other preprocedural examination: Secondary | ICD-10-CM | POA: Insufficient documentation

## 2015-09-07 DIAGNOSIS — Z0183 Encounter for blood typing: Secondary | ICD-10-CM | POA: Diagnosis not present

## 2015-09-07 DIAGNOSIS — Z01812 Encounter for preprocedural laboratory examination: Secondary | ICD-10-CM | POA: Diagnosis not present

## 2015-09-07 HISTORY — DX: Other specified postprocedural states: Z98.890

## 2015-09-07 HISTORY — DX: Nausea with vomiting, unspecified: R11.2

## 2015-09-07 HISTORY — DX: Adverse effect of unspecified anesthetic, initial encounter: T41.45XA

## 2015-09-07 HISTORY — DX: Other complications of anesthesia, initial encounter: T88.59XA

## 2015-09-07 HISTORY — DX: Other cervical disc degeneration, unspecified cervical region: M50.30

## 2015-09-07 HISTORY — DX: Urinary tract infection, site not specified: N39.0

## 2015-09-07 LAB — BASIC METABOLIC PANEL
ANION GAP: 8 (ref 5–15)
BUN: 13 mg/dL (ref 6–20)
CO2: 27 mmol/L (ref 22–32)
Calcium: 9.3 mg/dL (ref 8.9–10.3)
Chloride: 108 mmol/L (ref 101–111)
Creatinine, Ser: 0.84 mg/dL (ref 0.44–1.00)
GFR calc Af Amer: >60 mL/min (ref >60)
GFR calc non Af Amer: >60 mL/min (ref >60)
GLUCOSE: 116 mg/dL — AB (ref 65–99)
POTASSIUM: 4.3 mmol/L (ref 3.5–5.1)
Sodium: 143 mmol/L (ref 135–145)

## 2015-09-07 LAB — TYPE AND SCREEN
ABO/RH(D): A POS
ANTIBODY SCREEN: NEGATIVE

## 2015-09-07 LAB — CBC
HEMATOCRIT: 44.1 % (ref 36.0–46.0)
Hemoglobin: 14.3 g/dL (ref 12.0–15.0)
MCH: 29.2 pg (ref 26.0–34.0)
MCHC: 32.4 g/dL (ref 30.0–36.0)
MCV: 90 fL (ref 78.0–100.0)
Platelets: 217 10*3/uL (ref 150–400)
RBC: 4.9 MIL/uL (ref 3.87–5.11)
RDW: 13.1 % (ref 11.5–15.5)
WBC: 14.8 10*3/uL — AB (ref 4.0–10.5)

## 2015-09-07 LAB — ABO/RH: ABO/RH(D): A POS

## 2015-09-07 LAB — SURGICAL PCR SCREEN
MRSA, PCR: NEGATIVE
Staphylococcus aureus: NEGATIVE

## 2015-09-07 NOTE — Pre-Procedure Instructions (Signed)
Melody Huerta  09/07/2015      [id number] - [place], [place] - [address] AT [facility] OF [address] [place] [address] Phone: [phone] Fax: [phone]    Your procedure is scheduled on   09/15/15  Thursday    Report to [facility] Admitting at 815 A.M.  Call this number if you have problems the morning of surgery:  [phone]   Remember:  Do not eat food or drink liquids after midnight.  Take these medicines the morning of surgery with A SIP OF WATER   GABAPENTIN, NUCYNTA  OR OPANA   (STOP ASPIRIN, COUMADIN ,PLAVIX, EFFIENT, HERBAL MEDICINES ,CELECOXIB/ CELEBREX)   Do not wear jewelry, make-up or nail polish.  Do not wear lotions, powders, or perfumes.  You may wear deodorant.  Do not shave 48 hours prior to surgery.  Men may shave face and neck.  Do not bring valuables to the hospital.  [facility] is not responsible for any belongings or valuables.  Contacts, dentures or bridgework may not be worn into surgery.  Leave your suitcase in the car.  After surgery it may be brought to your room.  For patients admitted to the hospital, discharge time will be determined by your treatment team.  Patients discharged the day of surgery will not be allowed to drive home.   Name and phone number of your driver:    Special instructions:  [facility] - Preparing for Surgery  Before surgery, you can play an important role.  Because skin is not sterile, your skin needs to be as free of germs as possible.  You can reduce the number of germs on you skin by washing with CHG (chlorahexidine gluconate) soap before surgery.  CHG is an antiseptic cleaner which kills germs and bonds with the skin to continue killing germs even after washing.  Please DO NOT use if you have an allergy to CHG or antibacterial soaps.  If your skin becomes reddened/irritated stop using the CHG and inform your nurse when you arrive at Short Stay.  Do not  shave (including legs and underarms) for at least 48 hours prior to the first CHG shower.  You may shave your face.  Please follow these instructions carefully:   1.  Shower with CHG Soap the night before surgery and the                                morning of Surgery.  2.  If you choose to wash your hair, wash your hair first as usual with your       normal shampoo.  3.  After you shampoo, rinse your hair and body thoroughly to remove the                      Shampoo.  4.  Use CHG as you would any other liquid soap.  You can apply chg directly       to the skin and wash gently with scrungie or a clean washcloth.  5.  Apply the CHG Soap to your body ONLY FROM THE NECK DOWN.        Do not use on open wounds or open sores.  Avoid contact with your eyes,       ears, mouth and genitals (private parts).  Wash genitals (private parts)  with your normal soap.  6.  Wash thoroughly, paying special attention to the area where your surgery        will be performed.  7.  Thoroughly rinse your body with warm water from the neck down.  8.  DO NOT shower/wash with your normal soap after using and rinsing off       the CHG Soap.  9.  Pat yourself dry with a clean towel.            10.  Wear clean pajamas.            11.  Place clean sheets on your bed the night of your first shower and do not        sleep with pets.  Day of Surgery  Do not apply any lotions/deoderants the morning of surgery.  Please wear clean clothes to the hospital/surgery center.    Please read over the following fact sheets that you were given. Pain Booklet, Coughing and Deep Breathing, Blood Transfusion Information, MRSA Information and Surgical Site Infection Prevention

## 2015-09-07 NOTE — Progress Notes (Addendum)
Rechecked patient's temp at PAT appt 100.3, patient felt warm .  Patient stated she felt like she was getting something a couple days ago, but stated she did not know because she feels bad all the time.  Spoke with Darl Pikes at Dr .York Ram office re: elevated temp etc, she suggested we  do PAT labs and if abnormal they can f/u with patient's PCP.  Encouraged patient to let both PCP and Dr. Lovell Sheehan know if she did started  feeling like she was getting sick.   Spoke to Honaker at Dr. Candace Cruise about WBC 14.8.  Darl Pikes stated she will call patient.

## 2015-09-08 NOTE — Progress Notes (Signed)
Anesthesia Chart Review:  Pt is a 65 year old female scheduled for a C6-7 posterior cervical fusion with Dr. Lovell Sheehan. Originally scheduled 09/15/15. Pt presented to PAT on 09/07/15 with low grade fever, found to have elevated WBC count on labs. It appears surgery has now been postponed until 10/03/15. Pt will need repeat labs prior to surgery.   Rica Mast, FNP-BC [facility]/Anesthesiology Phone: [phone] 09/08/2015 2:32 PM

## 2015-09-27 ENCOUNTER — Other Ambulatory Visit: Payer: Self-pay | Admitting: Neurosurgery

## 2015-09-29 ENCOUNTER — Encounter (HOSPITAL_COMMUNITY)
Admission: RE | Admit: 2015-09-29 | Discharge: 2015-09-29 | Disposition: A | Discharge status: Home / Self Care | Payer: BLUE CROSS/BLUE SHIELD | Source: Ambulatory Visit | Attending: Neurosurgery | Admitting: Neurosurgery

## 2015-09-29 DIAGNOSIS — Z0183 Encounter for blood typing: Secondary | ICD-10-CM | POA: Diagnosis not present

## 2015-09-29 DIAGNOSIS — Z01812 Encounter for preprocedural laboratory examination: Secondary | ICD-10-CM | POA: Diagnosis not present

## 2015-09-29 LAB — TYPE AND SCREEN
ABO/RH(D): A POS
ANTIBODY SCREEN: NEGATIVE

## 2015-09-29 LAB — CBC
HCT: 46 % (ref 36.0–46.0)
HEMOGLOBIN: 14.4 g/dL (ref 12.0–15.0)
MCH: 28.1 pg (ref 26.0–34.0)
MCHC: 31.3 g/dL (ref 30.0–36.0)
MCV: 89.8 fL (ref 78.0–100.0)
PLATELETS: 258 10*3/uL (ref 150–400)
RBC: 5.12 MIL/uL — ABNORMAL HIGH (ref 3.87–5.11)
RDW: 13.4 % (ref 11.5–15.5)
WBC: 8.5 10*3/uL (ref 4.0–10.5)

## 2015-09-29 LAB — BASIC METABOLIC PANEL
ANION GAP: 10 (ref 5–15)
BUN: 10 mg/dL (ref 6–20)
CALCIUM: 9.6 mg/dL (ref 8.9–10.3)
CO2: 27 mmol/L (ref 22–32)
Chloride: 106 mmol/L (ref 101–111)
Creatinine, Ser: 0.79 mg/dL (ref 0.44–1.00)
Glucose, Bld: 115 mg/dL — ABNORMAL HIGH (ref 65–99)
Potassium: 3.9 mmol/L (ref 3.5–5.1)
Sodium: 143 mmol/L (ref 135–145)

## 2015-10-02 MED ORDER — VANCOMYCIN HCL IN DEXTROSE 1-5 GM/200ML-% IV SOLN
1000.0000 mg | INTRAVENOUS | Status: DC
  Filled 2015-10-02: qty 200

## 2015-10-03 ENCOUNTER — Encounter (HOSPITAL_COMMUNITY): Payer: Self-pay | Admitting: Certified Registered Nurse Anesthetist

## 2015-10-03 ENCOUNTER — Inpatient Hospital Stay (HOSPITAL_COMMUNITY): Payer: BLUE CROSS/BLUE SHIELD | Admitting: Certified Registered Nurse Anesthetist

## 2015-10-03 ENCOUNTER — Inpatient Hospital Stay (HOSPITAL_COMMUNITY): Payer: BLUE CROSS/BLUE SHIELD | Admitting: Emergency Medicine

## 2015-10-03 ENCOUNTER — Inpatient Hospital Stay (HOSPITAL_COMMUNITY): Payer: BLUE CROSS/BLUE SHIELD

## 2015-10-03 ENCOUNTER — Encounter (HOSPITAL_COMMUNITY)
Admission: RE | Disposition: A | Discharge status: Home / Self Care | Payer: Self-pay | Source: Ambulatory Visit | Attending: Neurosurgery

## 2015-10-03 ENCOUNTER — Inpatient Hospital Stay (HOSPITAL_COMMUNITY)
Admission: RE | Admit: 2015-10-03 | Discharge: 2015-10-04 | DRG: 473 | Disposition: A | Discharge status: Home / Self Care | Payer: BLUE CROSS/BLUE SHIELD | Source: Ambulatory Visit | Attending: Neurosurgery | Admitting: Neurosurgery

## 2015-10-03 DIAGNOSIS — S0101XA Laceration without foreign body of scalp, initial encounter: Secondary | ICD-10-CM | POA: Diagnosis not present

## 2015-10-03 DIAGNOSIS — E785 Hyperlipidemia, unspecified: Secondary | ICD-10-CM | POA: Diagnosis present

## 2015-10-03 DIAGNOSIS — Z880 Allergy status to penicillin: Secondary | ICD-10-CM | POA: Diagnosis not present

## 2015-10-03 DIAGNOSIS — Y658 Other specified misadventures during surgical and medical care: Secondary | ICD-10-CM | POA: Diagnosis not present

## 2015-10-03 DIAGNOSIS — Z79891 Long term (current) use of opiate analgesic: Secondary | ICD-10-CM

## 2015-10-03 DIAGNOSIS — Z419 Encounter for procedure for purposes other than remedying health state, unspecified: Secondary | ICD-10-CM

## 2015-10-03 DIAGNOSIS — G8929 Other chronic pain: Secondary | ICD-10-CM | POA: Diagnosis present

## 2015-10-03 DIAGNOSIS — Z883 Allergy status to other anti-infective agents status: Secondary | ICD-10-CM | POA: Diagnosis not present

## 2015-10-03 DIAGNOSIS — F329 Major depressive disorder, single episode, unspecified: Secondary | ICD-10-CM | POA: Diagnosis present

## 2015-10-03 DIAGNOSIS — Z888 Allergy status to other drugs, medicaments and biological substances status: Secondary | ICD-10-CM | POA: Diagnosis not present

## 2015-10-03 DIAGNOSIS — S129XXA Fracture of neck, unspecified, initial encounter: Secondary | ICD-10-CM | POA: Diagnosis present

## 2015-10-03 DIAGNOSIS — M960 Pseudarthrosis after fusion or arthrodesis: Principal | ICD-10-CM | POA: Diagnosis present

## 2015-10-03 DIAGNOSIS — G4730 Sleep apnea, unspecified: Secondary | ICD-10-CM | POA: Diagnosis present

## 2015-10-03 DIAGNOSIS — Z79899 Other long term (current) drug therapy: Secondary | ICD-10-CM

## 2015-10-03 DIAGNOSIS — M5412 Radiculopathy, cervical region: Secondary | ICD-10-CM | POA: Diagnosis present

## 2015-10-03 HISTORY — PX: POSTERIOR CERVICAL FUSION/FORAMINOTOMY: SHX5038

## 2015-10-03 SURGERY — POSTERIOR CERVICAL FUSION/FORAMINOTOMY LEVEL 1
Anesthesia: General

## 2015-10-03 MED ORDER — SCOPOLAMINE 1 MG/3DAYS TD PT72
MEDICATED_PATCH | TRANSDERMAL | Status: AC
  Administered 2015-10-03: 1 via TRANSDERMAL
  Filled 2015-10-03: qty 1

## 2015-10-03 MED ORDER — ESTRADIOL 0.0375 MG/24HR TD PTWK: 0.0375 mg | MEDICATED_PATCH | TRANSDERMAL | Status: DC

## 2015-10-03 MED ORDER — ONDANSETRON HCL 4 MG/2ML IJ SOLN
INTRAMUSCULAR | Status: DC | PRN
  Administered 2015-10-03 (×2): 4 mg via INTRAVENOUS

## 2015-10-03 MED ORDER — MORPHINE SULFATE (PF) 2 MG/ML IV SOLN
1.0000 mg | INTRAVENOUS | Status: DC | PRN
  Filled 2015-10-03: qty 2

## 2015-10-03 MED ORDER — BUPIVACAINE LIPOSOME 1.3 % IJ SUSP
INTRAMUSCULAR | Status: DC | PRN
  Administered 2015-10-03: 20 mL

## 2015-10-03 MED ORDER — FENTANYL CITRATE (PF) 100 MCG/2ML IJ SOLN
INTRAMUSCULAR | Status: DC | PRN
  Administered 2015-10-03: 100 ug via INTRAVENOUS
  Administered 2015-10-03: 50 ug via INTRAVENOUS

## 2015-10-03 MED ORDER — ONDANSETRON HCL 4 MG/2ML IJ SOLN: 4.0000 mg | INTRAMUSCULAR | Status: DC | PRN

## 2015-10-03 MED ORDER — PROPOFOL 10 MG/ML IV BOLUS
INTRAVENOUS | Status: AC
  Filled 2015-10-03: qty 40

## 2015-10-03 MED ORDER — PROMETHAZINE HCL 25 MG/ML IJ SOLN: 6.2500 mg | INTRAMUSCULAR | Status: DC | PRN

## 2015-10-03 MED ORDER — LACTATED RINGERS IV SOLN: INTRAVENOUS | Status: DC

## 2015-10-03 MED ORDER — NEOSTIGMINE METHYLSULFATE 10 MG/10ML IV SOLN
INTRAVENOUS | Status: DC | PRN
  Administered 2015-10-03: 5 mg via INTRAVENOUS

## 2015-10-03 MED ORDER — PROPOFOL 10 MG/ML IV BOLUS
INTRAVENOUS | Status: DC | PRN
  Administered 2015-10-03: 150 mg via INTRAVENOUS
  Administered 2015-10-03: 40 mg via INTRAVENOUS

## 2015-10-03 MED ORDER — DEXAMETHASONE SODIUM PHOSPHATE 4 MG/ML IJ SOLN
INTRAMUSCULAR | Status: AC
  Filled 2015-10-03: qty 1

## 2015-10-03 MED ORDER — OXYMORPHONE HCL ER 40 MG PO TB12
40.0000 mg | ORAL_TABLET | Freq: Two times a day (BID) | ORAL | Status: DC
  Administered 2015-10-03 – 2015-10-04 (×2): 40 mg via ORAL
  Filled 2015-10-03 (×2): qty 4

## 2015-10-03 MED ORDER — GABAPENTIN 300 MG PO CAPS
300.0000 mg | ORAL_CAPSULE | Freq: Three times a day (TID) | ORAL | Status: DC
  Administered 2015-10-03 – 2015-10-04 (×3): 300 mg via ORAL
  Filled 2015-10-03 (×3): qty 1

## 2015-10-03 MED ORDER — ACETAMINOPHEN 650 MG RE SUPP: 650.0000 mg | RECTAL | Status: DC | PRN

## 2015-10-03 MED ORDER — OXYCODONE HCL 5 MG PO TABS
15.0000 mg | ORAL_TABLET | ORAL | Status: DC | PRN
  Administered 2015-10-03: 15 mg via ORAL
  Filled 2015-10-03: qty 3

## 2015-10-03 MED ORDER — BISACODYL 10 MG RE SUPP: 10.0000 mg | Freq: Every day | RECTAL | Status: DC | PRN

## 2015-10-03 MED ORDER — SODIUM CHLORIDE 0.9 % IR SOLN
Status: DC | PRN
  Administered 2015-10-03: 07:00:00

## 2015-10-03 MED ORDER — ACETAMINOPHEN 10 MG/ML IV SOLN
INTRAVENOUS | Status: AC
  Administered 2015-10-03: 1000 mg via INTRAVENOUS
  Filled 2015-10-03: qty 100

## 2015-10-03 MED ORDER — TAPENTADOL HCL 100 MG PO TABS: 100.0000 mg | ORAL_TABLET | ORAL | Status: DC | PRN

## 2015-10-03 MED ORDER — MEPERIDINE HCL 25 MG/ML IJ SOLN: 6.2500 mg | INTRAMUSCULAR | Status: DC | PRN

## 2015-10-03 MED ORDER — GLYCOPYRROLATE 0.2 MG/ML IJ SOLN
INTRAMUSCULAR | Status: AC
  Filled 2015-10-03: qty 4

## 2015-10-03 MED ORDER — ROCURONIUM BROMIDE 100 MG/10ML IV SOLN
INTRAVENOUS | Status: DC | PRN
  Administered 2015-10-03: 10 mg via INTRAVENOUS
  Administered 2015-10-03: 50 mg via INTRAVENOUS
  Administered 2015-10-03 (×2): 10 mg via INTRAVENOUS

## 2015-10-03 MED ORDER — BUPIVACAINE LIPOSOME 1.3 % IJ SUSP
20.0000 mL | INTRAMUSCULAR | Status: AC
  Filled 2015-10-03: qty 20

## 2015-10-03 MED ORDER — PHENOL 1.4 % MT LIQD: 1.0000 | OROMUCOSAL | Status: DC | PRN

## 2015-10-03 MED ORDER — BUTALBITAL-APAP-CAFFEINE 50-325-40 MG PO TABS: 1.0000 | ORAL_TABLET | Freq: Two times a day (BID) | ORAL | Status: DC | PRN

## 2015-10-03 MED ORDER — TRAZODONE HCL 100 MG PO TABS
100.0000 mg | ORAL_TABLET | Freq: Every day | ORAL | Status: DC
  Administered 2015-10-03: 100 mg via ORAL
  Filled 2015-10-03: qty 1

## 2015-10-03 MED ORDER — FENTANYL CITRATE (PF) 100 MCG/2ML IJ SOLN: 25.0000 ug | INTRAMUSCULAR | Status: DC | PRN

## 2015-10-03 MED ORDER — BUPIVACAINE-EPINEPHRINE 0.5% -1:200000 IJ SOLN
INTRAMUSCULAR | Status: DC | PRN
  Administered 2015-10-03: 10 mL

## 2015-10-03 MED ORDER — VANCOMYCIN HCL IN DEXTROSE 1-5 GM/200ML-% IV SOLN
1000.0000 mg | INTRAVENOUS | Status: AC
  Administered 2015-10-03: 1000 mg via INTRAVENOUS

## 2015-10-03 MED ORDER — THROMBIN 5000 UNITS EX SOLR
CUTANEOUS | Status: DC | PRN
  Administered 2015-10-03 (×2): 5000 [IU] via TOPICAL

## 2015-10-03 MED ORDER — DIAZEPAM 5 MG PO TABS: 5.0000 mg | ORAL_TABLET | Freq: Four times a day (QID) | ORAL | Status: DC | PRN

## 2015-10-03 MED ORDER — MIDAZOLAM HCL 5 MG/5ML IJ SOLN
INTRAMUSCULAR | Status: DC | PRN
  Administered 2015-10-03 (×2): 1 mg via INTRAVENOUS

## 2015-10-03 MED ORDER — OXYCODONE-ACETAMINOPHEN 5-325 MG PO TABS
1.0000 | ORAL_TABLET | ORAL | Status: DC | PRN
  Administered 2015-10-03 – 2015-10-04 (×5): 2 via ORAL
  Filled 2015-10-03 (×5): qty 2

## 2015-10-03 MED ORDER — GLYCOPYRROLATE 0.2 MG/ML IJ SOLN
INTRAMUSCULAR | Status: DC | PRN
  Administered 2015-10-03: .8 mg via INTRAVENOUS

## 2015-10-03 MED ORDER — ZOLPIDEM TARTRATE 5 MG PO TABS: 5.0000 mg | ORAL_TABLET | Freq: Every evening | ORAL | Status: DC | PRN

## 2015-10-03 MED ORDER — DEXAMETHASONE SODIUM PHOSPHATE 4 MG/ML IJ SOLN
INTRAMUSCULAR | Status: DC | PRN
  Administered 2015-10-03: 4 mg via INTRAVENOUS

## 2015-10-03 MED ORDER — LACTATED RINGERS IV SOLN
INTRAVENOUS | Status: DC | PRN
  Administered 2015-10-03 (×3): via INTRAVENOUS

## 2015-10-03 MED ORDER — DIPHENHYDRAMINE HCL 50 MG/ML IJ SOLN
INTRAMUSCULAR | Status: DC | PRN
  Administered 2015-10-03: 25 mg via INTRAVENOUS

## 2015-10-03 MED ORDER — BACLOFEN 10 MG PO TABS
10.0000 mg | ORAL_TABLET | Freq: Three times a day (TID) | ORAL | Status: DC
  Administered 2015-10-03 (×2): 10 mg via ORAL
  Filled 2015-10-03 (×3): qty 1

## 2015-10-03 MED ORDER — HYDROCODONE-ACETAMINOPHEN 5-325 MG PO TABS: 1.0000 | ORAL_TABLET | ORAL | Status: DC | PRN

## 2015-10-03 MED ORDER — HEMOSTATIC AGENTS (NO CHARGE) OPTIME
TOPICAL | Status: DC | PRN
  Administered 2015-10-03: 1 via TOPICAL

## 2015-10-03 MED ORDER — DIPHENHYDRAMINE HCL 50 MG/ML IJ SOLN
INTRAMUSCULAR | Status: AC
  Filled 2015-10-03: qty 1

## 2015-10-03 MED ORDER — DOCUSATE SODIUM 100 MG PO CAPS
100.0000 mg | ORAL_CAPSULE | Freq: Two times a day (BID) | ORAL | Status: DC
  Administered 2015-10-03 (×2): 100 mg via ORAL
  Filled 2015-10-03 (×2): qty 1

## 2015-10-03 MED ORDER — VANCOMYCIN HCL IN DEXTROSE 1-5 GM/200ML-% IV SOLN
1000.0000 mg | Freq: Once | INTRAVENOUS | Status: AC
  Administered 2015-10-03: 1000 mg via INTRAVENOUS
  Filled 2015-10-03: qty 200

## 2015-10-03 MED ORDER — MENTHOL 3 MG MT LOZG: 1.0000 | LOZENGE | OROMUCOSAL | Status: DC | PRN

## 2015-10-03 MED ORDER — MIDAZOLAM HCL 2 MG/2ML IJ SOLN
INTRAMUSCULAR | Status: AC
  Filled 2015-10-03: qty 2

## 2015-10-03 MED ORDER — ACETAMINOPHEN 325 MG PO TABS: 650.0000 mg | ORAL_TABLET | ORAL | Status: DC | PRN

## 2015-10-03 MED ORDER — ALUM & MAG HYDROXIDE-SIMETH 200-200-20 MG/5ML PO SUSP: 30.0000 mL | Freq: Four times a day (QID) | ORAL | Status: DC | PRN

## 2015-10-03 MED ORDER — NEOSTIGMINE METHYLSULFATE 10 MG/10ML IV SOLN
INTRAVENOUS | Status: AC
  Filled 2015-10-03: qty 1

## 2015-10-03 MED ORDER — FENTANYL CITRATE (PF) 250 MCG/5ML IJ SOLN
INTRAMUSCULAR | Status: AC
  Filled 2015-10-03: qty 5

## 2015-10-03 MED ORDER — 0.9 % SODIUM CHLORIDE (POUR BTL) OPTIME
TOPICAL | Status: DC | PRN
  Administered 2015-10-03: 1000 mL

## 2015-10-03 MED ORDER — LIDOCAINE HCL (CARDIAC) 20 MG/ML IV SOLN
INTRAVENOUS | Status: DC | PRN
  Administered 2015-10-03: 100 mg via INTRAVENOUS

## 2015-10-03 MED ORDER — PHENYLEPHRINE HCL 10 MG/ML IJ SOLN
10.0000 mg | INTRAMUSCULAR | Status: DC | PRN
  Administered 2015-10-03: 25 ug/min via INTRAVENOUS

## 2015-10-03 MED ORDER — LIDOCAINE HCL (CARDIAC) 20 MG/ML IV SOLN
INTRAVENOUS | Status: AC
  Filled 2015-10-03: qty 5

## 2015-10-03 MED ORDER — TAPENTADOL HCL 50 MG PO TABS
100.0000 mg | ORAL_TABLET | ORAL | Status: DC | PRN
  Administered 2015-10-03 – 2015-10-04 (×6): 100 mg via ORAL
  Filled 2015-10-03 (×7): qty 2

## 2015-10-03 SURGICAL SUPPLY — 70 items
APL SKNCLS STERI-STRIP NONHPOA (GAUZE/BANDAGES/DRESSINGS) ×1
BAG DECANTER FOR FLEXI CONT (MISCELLANEOUS) ×2 IMPLANT
BENZOIN TINCTURE PRP APPL 2/3 (GAUZE/BANDAGES/DRESSINGS) ×1 IMPLANT
BIT DRILL 2.4X (BIT) IMPLANT
BIT DRILL NEURO 2X3.1 SFT TUCH (MISCELLANEOUS) ×1 IMPLANT
BIT DRL 2.4X (BIT) ×1
BLADE CLIPPER SURG (BLADE) IMPLANT
BLADE ULTRA TIP 2M (BLADE) IMPLANT
BRUSH SCRUB EZ 1% IODOPHOR (MISCELLANEOUS) IMPLANT
BRUSH SCRUB EZ PLAIN DRY (MISCELLANEOUS) IMPLANT
CANISTER SUCT 3000ML PPV (MISCELLANEOUS) ×2 IMPLANT
DECANTER SPIKE VIAL GLASS SM (MISCELLANEOUS) ×2 IMPLANT
DRAPE C-ARM 42X72 X-RAY (DRAPES) ×4 IMPLANT
DRAPE LAPAROTOMY 100X72 PEDS (DRAPES) ×2 IMPLANT
DRAPE MICROSCOPE LEICA (MISCELLANEOUS) IMPLANT
DRAPE POUCH INSTRU U-SHP 10X18 (DRAPES) ×2 IMPLANT
DRILL BIT (BIT) ×2
DRILL NEURO 2X3.1 SOFT TOUCH (MISCELLANEOUS) ×2
ELECT REM PT RETURN 9FT ADLT (ELECTROSURGICAL) ×2
ELECTRODE REM PT RTRN 9FT ADLT (ELECTROSURGICAL) ×1 IMPLANT
GAUZE SPONGE 4X4 12PLY STRL (GAUZE/BANDAGES/DRESSINGS) ×1 IMPLANT
GAUZE SPONGE 4X4 16PLY XRAY LF (GAUZE/BANDAGES/DRESSINGS) IMPLANT
GLOVE BIO SURGEON STRL SZ7 (GLOVE) ×1 IMPLANT
GLOVE BIO SURGEON STRL SZ8 (GLOVE) ×2 IMPLANT
GLOVE BIO SURGEON STRL SZ8.5 (GLOVE) ×2 IMPLANT
GLOVE ECLIPSE 9.0 STRL (GLOVE) ×1 IMPLANT
GLOVE EXAM NITRILE LRG STRL (GLOVE) IMPLANT
GLOVE EXAM NITRILE MD LF STRL (GLOVE) IMPLANT
GLOVE EXAM NITRILE XL STR (GLOVE) IMPLANT
GLOVE EXAM NITRILE XS STR PU (GLOVE) IMPLANT
GLOVE INDICATOR 7.5 STRL GRN (GLOVE) ×3 IMPLANT
GOWN STRL REUS W/ TWL LRG LVL3 (GOWN DISPOSABLE) IMPLANT
GOWN STRL REUS W/ TWL XL LVL3 (GOWN DISPOSABLE) ×1 IMPLANT
GOWN STRL REUS W/TWL 2XL LVL3 (GOWN DISPOSABLE) ×2 IMPLANT
GOWN STRL REUS W/TWL LRG LVL3 (GOWN DISPOSABLE)
GOWN STRL REUS W/TWL XL LVL3 (GOWN DISPOSABLE) ×2
HEMOSTAT SURGICEL 2X14 (HEMOSTASIS) ×2 IMPLANT
KIT BASIN OR (CUSTOM PROCEDURE TRAY) ×2 IMPLANT
KIT INFUSE XX SMALL 0.7CC (Orthopedic Implant) ×1 IMPLANT
KIT ROOM TURNOVER OR (KITS) ×2 IMPLANT
NDL HYPO 21X1.5 SAFETY (NEEDLE) IMPLANT
NDL SPNL 18GX3.5 QUINCKE PK (NEEDLE) IMPLANT
NEEDLE HYPO 21X1.5 SAFETY (NEEDLE) ×2 IMPLANT
NEEDLE HYPO 22GX1.5 SAFETY (NEEDLE) ×2 IMPLANT
NEEDLE SPNL 18GX3.5 QUINCKE PK (NEEDLE) IMPLANT
NS IRRIG 1000ML POUR BTL (IV SOLUTION) ×2 IMPLANT
PACK LAMINECTOMY NEURO (CUSTOM PROCEDURE TRAY) ×2 IMPLANT
PAD ARMBOARD 7.5X6 YLW CONV (MISCELLANEOUS) ×6 IMPLANT
PATTIES SURGICAL .25X.25 (GAUZE/BANDAGES/DRESSINGS) IMPLANT
PIN MAYFIELD SKULL DISP (PIN) ×2 IMPLANT
PUTTY BIOACTIVE 2CC KINEX (Miscellaneous) ×1 IMPLANT
ROD PRECUT BULLET 3.5X25 (Rod) ×2 IMPLANT
RUBBERBAND STERILE (MISCELLANEOUS) IMPLANT
SCREW CANCELLOUS 3.5X14MM (Screw) ×4 IMPLANT
SCREW SET M6 (Screw) ×4 IMPLANT
SPONGE LAP 4X18 X RAY DECT (DISPOSABLE) IMPLANT
SPONGE NEURO XRAY DETECT 1X3 (DISPOSABLE) IMPLANT
SPONGE SURGIFOAM ABS GEL SZ50 (HEMOSTASIS) IMPLANT
STAPLER SKIN PROX WIDE 3.9 (STAPLE) IMPLANT
STRIP CLOSURE SKIN 1/2X4 (GAUZE/BANDAGES/DRESSINGS) ×1 IMPLANT
SUT ETHILON 2 0 FS 18 (SUTURE) IMPLANT
SUT VIC AB 0 CT1 18XCR BRD8 (SUTURE) ×1 IMPLANT
SUT VIC AB 0 CT1 8-18 (SUTURE) ×2
SUT VIC AB 2-0 CP2 18 (SUTURE) ×2 IMPLANT
SYR 20CC LL (SYRINGE) ×1 IMPLANT
TAPE CLOTH SURG 4X10 WHT LF (GAUZE/BANDAGES/DRESSINGS) ×1 IMPLANT
TOWEL OR 17X24 6PK STRL BLUE (TOWEL DISPOSABLE) ×2 IMPLANT
TOWEL OR 17X26 10 PK STRL BLUE (TOWEL DISPOSABLE) ×2 IMPLANT
TRAY FOLEY W/METER SILVER 14FR (SET/KITS/TRAYS/PACK) IMPLANT
WATER STERILE IRR 1000ML POUR (IV SOLUTION) ×2 IMPLANT

## 2015-10-03 NOTE — Anesthesia Postprocedure Evaluation (Signed)
Anesthesia Post Note  Patient: Melody Huerta  Procedure(s) Performed: Procedure(s) (LRB): POSTERIOR CERVICAL FUSION/FORAMINOTOMY CERVICAL SIX-SEVEN WITH LATERAL MASS FIXATION (N/A)  Patient location during evaluation: PACU Anesthesia Type: General Level of consciousness: awake and alert Pain management: pain level controlled Vital Signs Assessment: post-procedure vital signs reviewed and stable Respiratory status: spontaneous breathing, nonlabored ventilation, respiratory function stable and patient connected to nasal cannula oxygen Cardiovascular status: blood pressure returned to baseline and stable Postop Assessment: no signs of nausea or vomiting Anesthetic complications: no    Last Vitals:  Filed Vitals:   10/03/15 1038 10/03/15 1042  BP: 127/75   Pulse: 76 72  Temp:  36.5 C  Resp: 13 26    Last Pain:  Filed Vitals:   10/03/15 1043  PainSc: 7                  Phillips Grout

## 2015-10-03 NOTE — Transfer of Care (Signed)
Immediate Anesthesia Transfer of Care Note  Patient: Melody Huerta  Procedure(s) Performed: Procedure(s): POSTERIOR CERVICAL FUSION/FORAMINOTOMY CERVICAL SIX-SEVEN WITH LATERAL MASS FIXATION (N/A)  Patient Location: PACU  Anesthesia Type:General  Level of Consciousness: awake, alert  and patient cooperative, moving all extremities  Airway & Oxygen Therapy: Patient Spontanous Breathing and Patient connected to nasal cannula oxygen  Post-op Assessment: Report given to RN and Post -op Vital signs reviewed and stable  Post vital signs: Reviewed and stable  Last Vitals:  Filed Vitals:   10/03/15 0646 10/03/15 0935  BP: 139/78   Pulse: 86 87  Temp: 36.9 C 36.4 C  Resp: 16     Complications: No apparent anesthesia complications

## 2015-10-03 NOTE — Progress Notes (Signed)
Subjective:  The patient is alert and pleasant. She is in no apparent distress.  Objective: Vital signs in last 24 hours: Temp:  [97.6 F (36.4 C)-98.4 F (36.9 C)] 97.6 F (36.4 C) (02/27 0935) Pulse Rate:  [75-87] 75 (02/27 0953) Resp:  [15-18] 18 (02/27 0953) BP: (139-153)/(74-78) 146/74 mmHg (02/27 0953) SpO2:  [95 %-99 %] 99 % (02/27 0953) Weight:  [83.915 kg (185 lb)] 83.915 kg (185 lb) (02/27 0646)  Intake/Output from previous day:   Intake/Output this shift: Total I/O In: 1100 [I.V.:1100] Out: 25 [Blood:25]  Physical exam the patient is alert and pleasant. She is moving all 4 extremities well.  Lab Results: No results for input(s): WBC, HGB, HCT, PLT in the last 72 hours. BMET No results for input(s): NA, K, CL, CO2, GLUCOSE, BUN, CREATININE, CALCIUM in the last 72 hours.  Studies/Results: No results found.  Assessment/Plan: The patient is doing well. I spoke with her husband.  LOS: 0 days     Melody Huerta D 10/03/2015, 10:07 AM

## 2015-10-03 NOTE — Anesthesia Procedure Notes (Signed)
Procedure Name: Intubation Date/Time: 10/03/2015 7:37 AM Performed by: Bobbie Stack Pre-anesthesia Checklist: Patient identified, Emergency Drugs available, Timeout performed, Suction available and Patient being monitored Patient Re-evaluated:Patient Re-evaluated prior to inductionOxygen Delivery Method: Circle system utilized Preoxygenation: Pre-oxygenation with 100% oxygen Intubation Type: IV induction Ventilation: Mask ventilation without difficulty Laryngoscope Size: Mac and 3 Grade View: Grade II Tube type: Oral Tube size: 7.0 mm Number of attempts: 2 Airway Equipment and Method: Stylet Placement Confirmation: ETT inserted through vocal cords under direct vision,  breath sounds checked- equal and bilateral and positive ETCO2 Secured at: 20 cm Tube secured with: Tape Dental Injury: Teeth and Oropharynx as per pre-operative assessment  Comments: Dl x1 wil Mil 2, no view.  DL x2 with MAC 3 grade 2 view.  EBBS VSS.

## 2015-10-03 NOTE — Progress Notes (Signed)
Pharmacy Antibiotic Note  Melody Huerta is a 65 y.o. female admitted on 10/03/2015 for neurosurgery. Pharmacy has been consulted for vancomycin dosing for prophylaxis.  Pre-op dose of vancomycin 1g IV given at 0739 this morning. No drain in place.  Plan: Vancomycin 1g IV x1 to be given at 1800 tonight No further doses needed- pharmacy to sign off  Height: 5' 8.5" (174 cm) Weight: 185 lb (83.915 kg) IBW/kg (Calculated) : 65.05  Temp (24hrs), Avg:97.9 F (36.6 C), Min:97.6 F (36.4 C), Max:98.4 F (36.9 C)   Recent Labs Lab 09/29/15 1126  WBC 8.5  CREATININE 0.79    Estimated Creatinine Clearance: 83.6 mL/min (by C-G formula based on Cr of 0.79).    Allergies  Allergen Reactions  . Botox [Botulinum Toxin Type A] Swelling  . Bactrim [Sulfamethoxazole-Trimethoprim] Rash    Double strength   . Penicillins Rash    Has patient had a PCN reaction causing immediate rash, facial/tongue/throat swelling, SOB or lightheadedness with hypotension: Yes Has patient had a PCN reaction causing severe rash involving mucus membranes or skin necrosis: No Has patient had a PCN reaction that required hospitalization No Has patient had a PCN reaction occurring within the last 10 years: No If all of the above answers are "NO", then may proceed with Cephalosporin use.     Antimicrobials this admission: vanc 2/27 x2 doses  Dose adjustments this admission: N/a   Microbiology results: n/a  Thank you for allowing pharmacy to be a part of this patient's care.  Aliahna Statzer 10/03/2015 11:34 AM

## 2015-10-03 NOTE — Anesthesia Preprocedure Evaluation (Addendum)
Anesthesia Evaluation  Patient identified by MRN, date of birth, ID band Patient awake    Reviewed: Allergy & Precautions, NPO status , Patient's Chart, lab work & pertinent test results  History of Anesthesia Complications (+) PONV  Airway Mallampati: II  TM Distance: >3 FB Neck ROM: Full    Dental no notable dental hx.    Pulmonary sleep apnea ,    Pulmonary exam normal breath sounds clear to auscultation       Cardiovascular negative cardio ROS Normal cardiovascular exam Rhythm:Regular Rate:Normal     Neuro/Psych negative neurological ROS  negative psych ROS   GI/Hepatic negative GI ROS, Neg liver ROS,   Endo/Other  negative endocrine ROS  Renal/GU negative Renal ROS  negative genitourinary   Musculoskeletal negative musculoskeletal ROS (+)   Abdominal   Peds negative pediatric ROS (+)  Hematology negative hematology ROS (+)   Anesthesia Other Findings   Reproductive/Obstetrics negative OB ROS                            Anesthesia Physical Anesthesia Plan  ASA: II  Anesthesia Plan: General   Post-op Pain Management:    Induction: Intravenous  Airway Management Planned: Oral ETT  Additional Equipment:   Intra-op Plan:   Post-operative Plan: Extubation in OR  Informed Consent: I have reviewed the patients History and Physical, chart, labs and discussed the procedure including the risks, benefits and alternatives for the proposed anesthesia with the patient or authorized representative who has indicated his/her understanding and acceptance.   Dental advisory given  Plan Discussed with: CRNA  Anesthesia Plan Comments:         Anesthesia Quick Evaluation

## 2015-10-03 NOTE — H&P (Signed)
The patient is a 65 year old white female who has had several prior anterior cervical discectomy fusion and plating. She has developed recurrent neck and arm pain. 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 I discussed the various treatment options with the patient including surgery. She has decided to proceed with surgery. Subjective:    Past Medical History  Diagnosis Date  . Gallstones   . Migraines   . Arthritis   . Depression   . Chronic pain disorder   . HLD (hyperlipidemia)     pt denies  . Complication of anesthesia     WITH LAST CERVICAL SURGERY PT VERY SICK N+V  . PONV (postoperative nausea and vomiting)   . Sleep apnea     on CPAP  NOT [place] TO USE  (USING MOSES MOUTH PIECE)  . UTI (lower urinary tract infection)     HX  UTI    . DDD (degenerative disc disease), cervical     CERV+ LUMBAR    Past Surgical History  Procedure Laterality Date  . Left shoulder arthroscopy Left 09/08/08    Leandra Kern MD, frozen shoulder  . L4,l5,s1,s2 fusion  1998  . S1 fusion  2000 and 2001  . 2nd lumbar fusion  2001  . Rotator cuff repair Right 2002  . Cholecystectomy    . Abdominal hysterectomy    . Sacroiliac joint fusion    . Periformis      2001 OR 02  . Cervical disc surgery      2013   FUSION  . Tonsillectomy    . Nasal sinus surgery      1980S    Allergies  Allergen Reactions  . Botox [Botulinum Toxin Type A] Swelling  . Bactrim [Sulfamethoxazole-Trimethoprim] Rash    Double strength   . Penicillins Rash    Has patient had a PCN reaction causing immediate rash, facial/tongue/throat swelling, SOB or lightheadedness with hypotension: Yes Has patient had a PCN reaction causing severe rash involving mucus membranes or skin necrosis: No Has patient had a PCN reaction that required hospitalization No Has patient had a PCN reaction occurring within the last 10 years: No If all of the above answers  are "NO", then may proceed with Cephalosporin use.     Social History  Substance Use Topics  . Smoking status: Never Smoker   . Smokeless tobacco: Never Used     Comment: tobacco use - no  . Alcohol Use: No    Family History  Problem Relation Age of Onset  . Stroke Father   . Heart disease Father   . COPD Father     smoker  . Alzheimer's disease Mother   . Migraines Mother   . Diabetes Paternal Grandfather   . Diabetes Paternal Grandmother   . Diabetes Maternal Grandfather   . Colon cancer Neg Hx    Prior to Admission medications   Medication Sig Start Date End Date Taking? Authorizing Provider  baclofen (LIORESAL) 10 MG tablet Take 10 mg by mouth 3 (three) times daily.   Yes Historical Provider, MD  butalbital-acetaminophen-caffeine (FIORICET, ESGIC) 50-325-40 MG tablet Take 1 tablet by mouth 2 (two) times daily as needed for headache.   Yes Historical Provider, MD  celecoxib (CELEBREX) 200 MG capsule Take 200 mg by mouth daily.     Yes Historical Provider, MD  estradiol (CLIMARA - DOSED IN MG/24 HR) 0.0375 mg/24hr patch Place 1 patch onto the skin  once a week. Sunday 08/02/15  Yes Historical Provider, MD  gabapentin (NEURONTIN) 300 MG capsule Take 300 mg by mouth every 8 (eight) hours. 08/05/15  Yes Historical Provider, MD  NUCYNTA 100 MG TABS Take 100 mg by mouth every 4 (four) hours as needed (pain).  08/05/15  Yes Historical Provider, MD  oxymorphone (OPANA ER) 40 MG 12 hr tablet Take 40 mg by mouth every 12 (twelve) hours.     Yes Historical Provider, MD  traZODone (DESYREL) 50 MG tablet Take 100 mg by mouth at bedtime.  04/16/14  Yes Historical Provider, MD  zolpidem (AMBIEN CR) 12.5 MG CR tablet Take 12.5 mg by mouth at bedtime as needed for sleep.   Yes Historical Provider, MD     Review of Systems  Positive ROS: As above  All other systems have been reviewed and were otherwise negative with the exception of those mentioned in the HPI and as above.  Objective: Vital  signs in last 24 hours: Temp:  [98.4 F (36.9 C)] 98.4 F (36.9 C) (02/27 0646) Pulse Rate:  [86] 86 (02/27 0646) Resp:  [16] 16 (02/27 0646) BP: (139)/(78) 139/78 mmHg (02/27 0646) SpO2:  [95 %] 95 % (02/27 0646) Weight:  [83.915 kg (185 lb)] 83.915 kg (185 lb) (02/27 0646)  General Appearance: Alert, cooperative, no distress, Head: Normocephalic, without obvious abnormality, atraumatic Eyes: PERRL, conjunctiva/corneas clear, EOM's intact,    Ears: Normal  Throat: Normal  Neck: Supple, symmetrical, trachea midline, no adenopathy; thyroid: No enlargement/tenderness/nodules; no carotid bruit or JVD, her anterior cervical incision is well-healed. Back: Symmetric, no curvature, ROM normal, no CVA tenderness Lungs: Clear to auscultation bilaterally, respirations unlabored Heart: Regular rate and rhythm, no murmur, rub or gallop Abdomen: Soft, non-tender,, no masses, no organomegaly Extremities: Extremities normal, atraumatic, no cyanosis or edema Pulses: 2+ and symmetric all extremities Skin: Skin color, texture, turgor normal, no rashes or lesions  NEUROLOGIC:   Mental status: alert and oriented, no aphasia, good attention span, Fund of knowledge/ memory ok Motor Exam - grossly normal Sensory Exam - grossly normal Reflexes:  Coordination - grossly normal Gait - grossly normal Balance - grossly normal Cranial Nerves: I: smell Not tested  II: visual acuity  OS: Normal  OD: Normal   II: visual fields Full to confrontation  II: pupils Equal, round, reactive to light  III,VII: ptosis None  III,IV,VI: extraocular muscles  Full ROM  V: mastication Normal  V: facial light touch sensation  Normal  V,VII: corneal reflex  Present  VII: facial muscle function - upper  Normal  VII: facial muscle function - lower Normal  VIII: hearing Not tested  IX: soft palate elevation  Normal  IX,X: gag reflex Present  XI: trapezius strength  5/5  XI: sternocleidomastoid strength 5/5  XI: neck  flexion strength  5/5  XII: tongue strength  Normal    Data Review Lab Results  Component Value Date   WBC 8.5 09/29/2015   HGB 14.4 09/29/2015   HCT 46.0 09/29/2015   MCV 89.8 09/29/2015   PLT 258 09/29/2015   Lab Results  Component Value Date   NA 143 09/29/2015   K 3.9 09/29/2015   CL 106 09/29/2015   CO2 27 09/29/2015   BUN 10 09/29/2015   CREATININE 0.79 09/29/2015   GLUCOSE 115* 09/29/2015   No results found for: INR, PROTIME  Assessment/Plan: C6-7 pseudoarthrosis, cervicalgia, cervical radiculopathy: I have discussed the situation with the patient and reviewed her imaging studies with her. We  have discussed the various treatment options including surgery. I described the posterior cervical agitation effusion. I have shown her surgical models. We have discussed the risks, benefits, alternatives, and likelihood of achieving her goals with surgery. I have answered all her questions. She has decided to proceed with surgery.   Jaylei Fuerte D 10/03/2015 7:27 AM

## 2015-10-03 NOTE — Op Note (Signed)
Brief history: The patient is 65 year old white female who is had previous anterior cervical fusions and platings. She has developed recurrent neck and arm pain. She has failed medical management and was worked up with cervical x-rays which demonstrated a pseudoarthrosis at C6-7. I discussed the situation with the patient and reviewed her imaging studies with her. We discussed the various treatment options including surgery. She has decided to proceed with a C6-7 posterior instrumentation and fusion.  Preoperative diagnosis: Cervical pseudoarthrosis, cervicalgia, cervical radiculopathy  Postoperative diagnosis: The same  Procedure: C6-7 posterior lateral arthrodesis with local morselized autograft bone, bone morphogenic protein-soaked collagen sponges, and Bone graft extender; C6-7 posterior instrumentation with Medtronic titanium lateral mass screws and rods.  Surgeon: Dr. Delma Officer  Assistant: Dr. Altamease Oiler  Anesthesia Gen. endotracheal  Estimated blood loss: Minimal  Specimens: None  Drains: None  Complications: None  Description of procedure: The patient was brought to the operating room by the anesthesia team. General endotracheal anesthesia was induced. I applied the Mayfield 3 point head rest of the patient's calvarium. The patient was turned to the prone position on the chest rolls. The patient's suboccipital region was then shaved with clippers and this region, the posterior cervical region and upper thorax were prepared with Betadine scrub and Betadine solution. Sterile drapes were applied. I injected the area to be incised with Marcaine with epinephrine solution. I scalpel to make a midline incision over the cervical thoracic junction. I used electrocautery to perform a bilateral subperiosteal dissection exposing the spinous process and lamina from C5-C7. We used intraoperative fluoroscopy to confirm our level. We inserted the cerebellar retractors for exposure. I used the cautery  to expose the lateral masses at C6 and C7. We could not see C6 or C7 using fluoroscopy. We therefore used anatomic landmarks. We identified the center of the lateral mass of C6 and C7 bilaterally. We drilled a 14 hole aiming in a cephalad and lateral direction at each lateral mass. We probed inside the drill hole with a ball probe to rule out cortical breaches. There were none. We then tapped the drill holes and inserted a 14 mm polyaxial screws into the lateral masses at C6 and C7 bilaterally. We obtained good bony purchase. We then placed a rod between the screws unilaterally. We secured the rod in place with the caps. This completed the instrumentation.  We now turned attention to the arthrodesis. We used a high-speed drill to decorticate the lateral masses, and lateral lamina at C6-7 bilaterally. We placed a bone morphogenic protein-soaked collagen sponge, some of the autograft bone we obtained during drilling, and Bone graft extender over the decorticated posterior lateral structures completing the posterior lateral arthrodesis at C6-7 bilaterally.  We then obtained hemostasis using bipolar cautery. We removed the retractor and reapproximated the patient's cervical thoracic fascia with interrupted 0 Vicryl suture. We were approximate the subcutaneous tissue with interrupted 2-0 Vicryl suture. We reapproximated the skin with Steri-Strips and benzoin. The wound was then coated with bacitracin ointment. A sterile dressing was applied. The drapes were removed. The patient was returned to the supine position. I removed the Mayfield 3. head rest from the patient's calvarium. We did encounter a scalp laceration from the Mayfield frame. I reapproximated it with stainless steel staples. The patient was subsequently exudate by the anesthesia team and transported to the post anesthesia care in stable condition. By report all sponge, instrument, and needle counts were correct at the end of this case.

## 2015-10-03 NOTE — Care Management (Signed)
Utilization review completed. Dyshawn Cangelosi, RN Case Manager [phone]. 

## 2015-10-04 ENCOUNTER — Encounter (HOSPITAL_COMMUNITY): Payer: Self-pay | Admitting: Neurosurgery

## 2015-10-04 NOTE — Discharge Instructions (Signed)

## 2015-10-04 NOTE — Progress Notes (Signed)
Pt doing well. Pt and husband given D/C instructions with verbal understanding. Pt's incision is covered with gauze dressing and is clean and dry with no sign of infection. Pt's IV was removed prior to D/C. Pt D/C'd home via wheelchair @ [phone] per MD order. Pt is stable @ D/C and has no other needs at this time. Rema Fendt, RN

## 2015-10-04 NOTE — Discharge Summary (Signed)
Physician Discharge Summary  Patient ID: Melody Huerta MRN: [id number] DOB/AGE: 08-Feb-1951 65 y.o.  Admit date: 10/03/2015 Discharge date: 10/04/2015  Admission Diagnoses: Cervical pseudoarthrosis, cervicalgia, cervical radiculopathy  Discharge Diagnoses: The same Active Problems:   Cervical pseudoarthrosis [facility])   Discharged Condition: good  Hospital Course: I performed a C6-7 posterior instrumentation and fusion on the patient on 10/03/2015. The surgery went well.  The patient's postoperative course was unremarkable. On postoperative day #1 the patient requested discharge to home. She was given written and oral discharge instructions. All questions were answered.  Consults: None Significant Diagnostic Studies: None Treatments: C6-7 posterior instrumentation and fusion Discharge Exam: Blood pressure 134/69, pulse 88, temperature 98.7 F (37.1 C), temperature source Oral, resp. rate 18, height 5' 8.5" (1.74 m), weight 83.915 kg (185 lb), SpO2 98 %. The patient is alert and pleasant. She looks well. She is moving all 4 extremities well.  Disposition: Home  Discharge Instructions    Call MD for:  difficulty breathing, headache or visual disturbances    Complete by:  As directed      Call MD for:  extreme fatigue    Complete by:  As directed      Call MD for:  hives    Complete by:  As directed      Call MD for:  persistant dizziness or light-headedness    Complete by:  As directed      Call MD for:  persistant nausea and vomiting    Complete by:  As directed      Call MD for:  redness, tenderness, or signs of infection (pain, swelling, redness, odor or green/yellow discharge around incision site)    Complete by:  As directed      Call MD for:  severe uncontrolled pain    Complete by:  As directed      Call MD for:  temperature >100.4    Complete by:  As directed      Diet - low sodium heart healthy    Complete by:  As directed      Discharge instructions    Complete  by:  As directed   Call [phone] for a followup appointment. Take a stool softener while you are using pain medications.     Driving Restrictions    Complete by:  As directed   Do not drive for 2 weeks.     Increase activity slowly    Complete by:  As directed      Lifting restrictions    Complete by:  As directed   Do not lift more than 5 pounds. No excessive bending or twisting.     May shower / Bathe    Complete by:  As directed   He may shower after the pain she is removed 3 days after surgery. Leave the incision alone.     Remove dressing in 48 hours    Complete by:  As directed   Your stitches are under the scan and will dissolve by themselves. The Steri-Strips will fall off after you take a few showers. Do not rub back or pick at the wound, Leave the wound alone.            Medication List    STOP taking these medications        celecoxib 200 MG capsule  Commonly known as:  CELEBREX      TAKE these medications        baclofen 10 MG tablet  Commonly  known as:  LIORESAL  Take 10 mg by mouth 3 (three) times daily.     butalbital-acetaminophen-caffeine 50-325-40 MG tablet  Commonly known as:  FIORICET, ESGIC  Take 1 tablet by mouth 2 (two) times daily as needed for headache.     estradiol 0.0375 mg/24hr patch  Commonly known as:  CLIMARA - Dosed in mg/24 hr  Place 1 patch onto the skin once a week. Sunday     gabapentin 300 MG capsule  Commonly known as:  NEURONTIN  Take 300 mg by mouth every 8 (eight) hours.     NUCYNTA 100 MG Tabs  Generic drug:  Tapentadol HCl  Take 100 mg by mouth every 4 (four) hours as needed (pain).     oxymorphone 40 MG 12 hr tablet  Commonly known as:  OPANA ER  Take 40 mg by mouth every 12 (twelve) hours.     traZODone 50 MG tablet  Commonly known as:  DESYREL  Take 100 mg by mouth at bedtime.     zolpidem 12.5 MG CR tablet  Commonly known as:  AMBIEN CR  Take 12.5 mg by mouth at bedtime as needed for sleep.          SignedCristi Loron 10/04/2015, 6:58 AM

## 2016-05-15 ENCOUNTER — Other Ambulatory Visit: Payer: Self-pay | Admitting: Obstetrics and Gynecology

## 2016-05-15 DIAGNOSIS — Z1231 Encounter for screening mammogram for malignant neoplasm of breast: Secondary | ICD-10-CM

## 2016-05-23 ENCOUNTER — Ambulatory Visit
Admission: RE | Admit: 2016-05-23 | Discharge: 2016-05-23 | Disposition: A | Discharge status: Home / Self Care | Payer: 59 | Source: Ambulatory Visit | Attending: Obstetrics and Gynecology | Admitting: Obstetrics and Gynecology

## 2016-05-23 DIAGNOSIS — Z1231 Encounter for screening mammogram for malignant neoplasm of breast: Secondary | ICD-10-CM

## 2016-05-23 IMAGING — MG DIGITAL SCREENING BILATERAL MAMMOGRAM WITH CAD
5 series · 5 of 5 positions shown · non-contrast
Comparison: Previous exam(s).

CLINICAL DATA: Screening.

EXAM:
DIGITAL SCREENING BILATERAL MAMMOGRAM WITH CAD

[L CC]
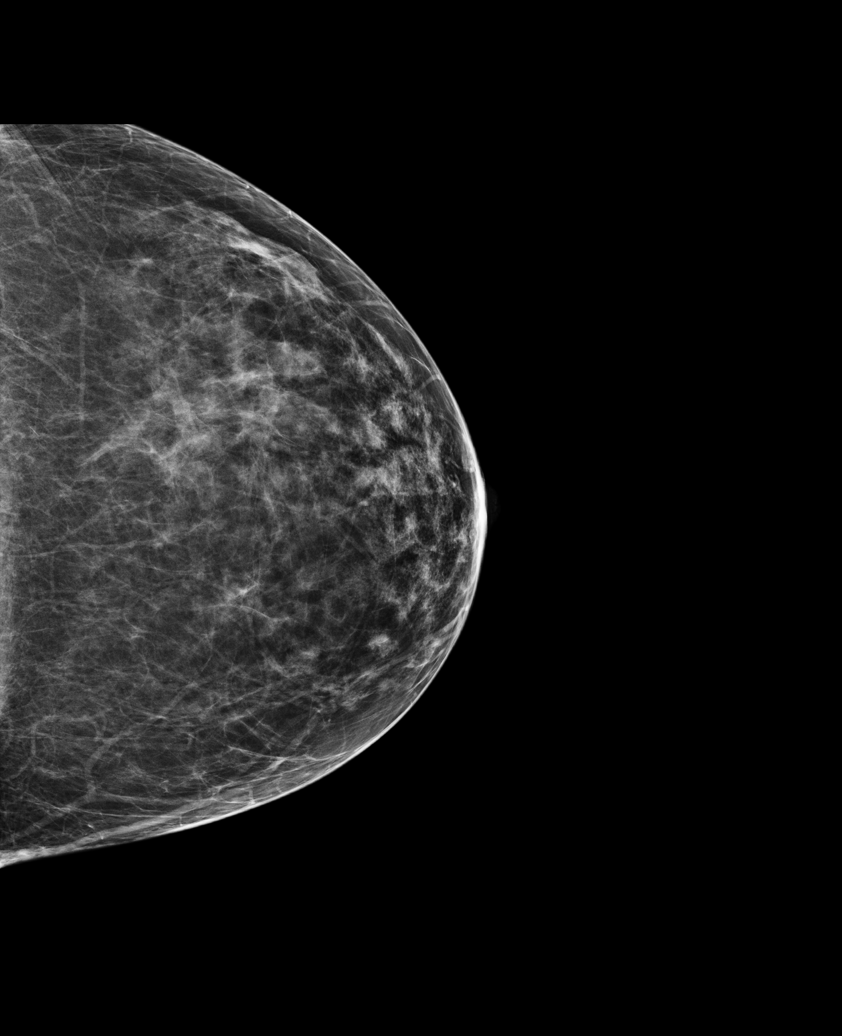

[L MLO (1 of 2)]
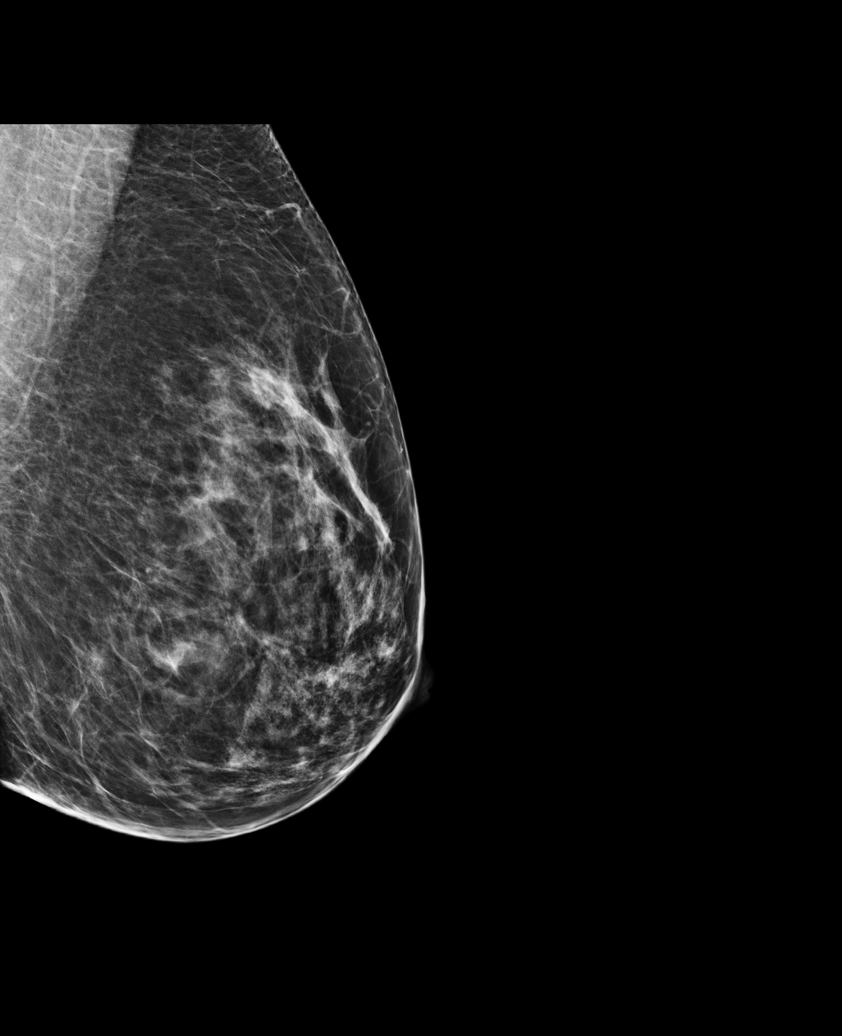

[R MLO]
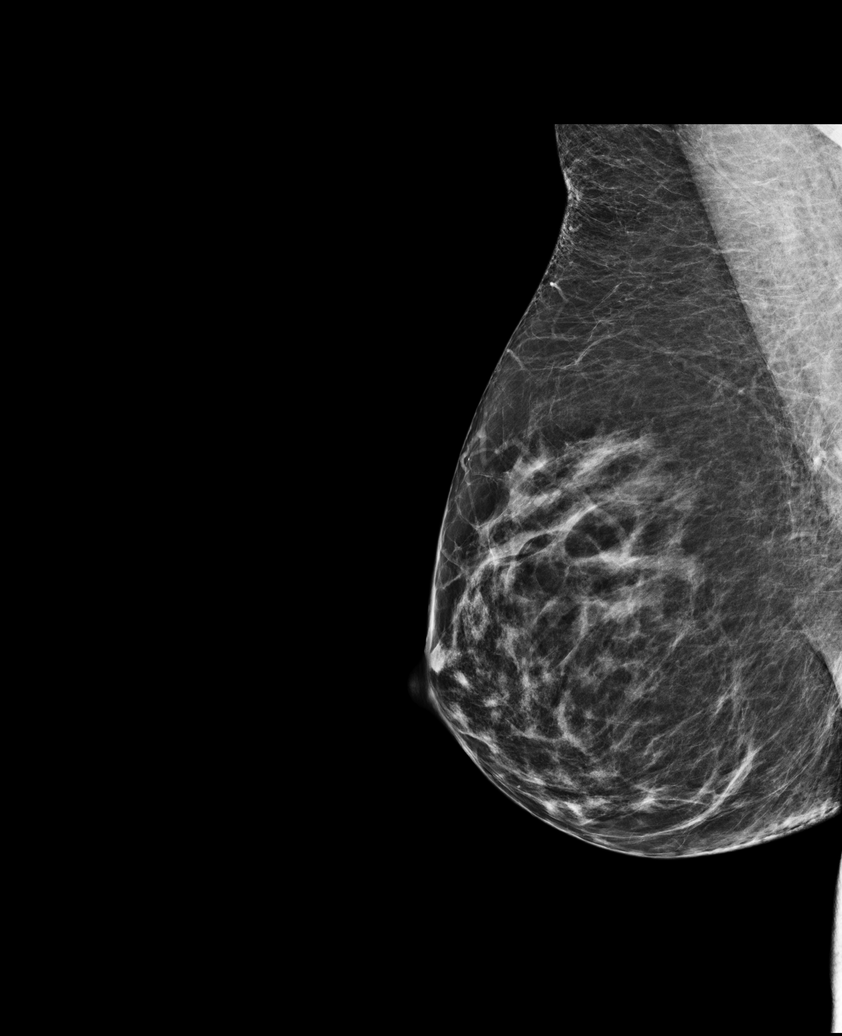

[L MLO (2 of 2)]
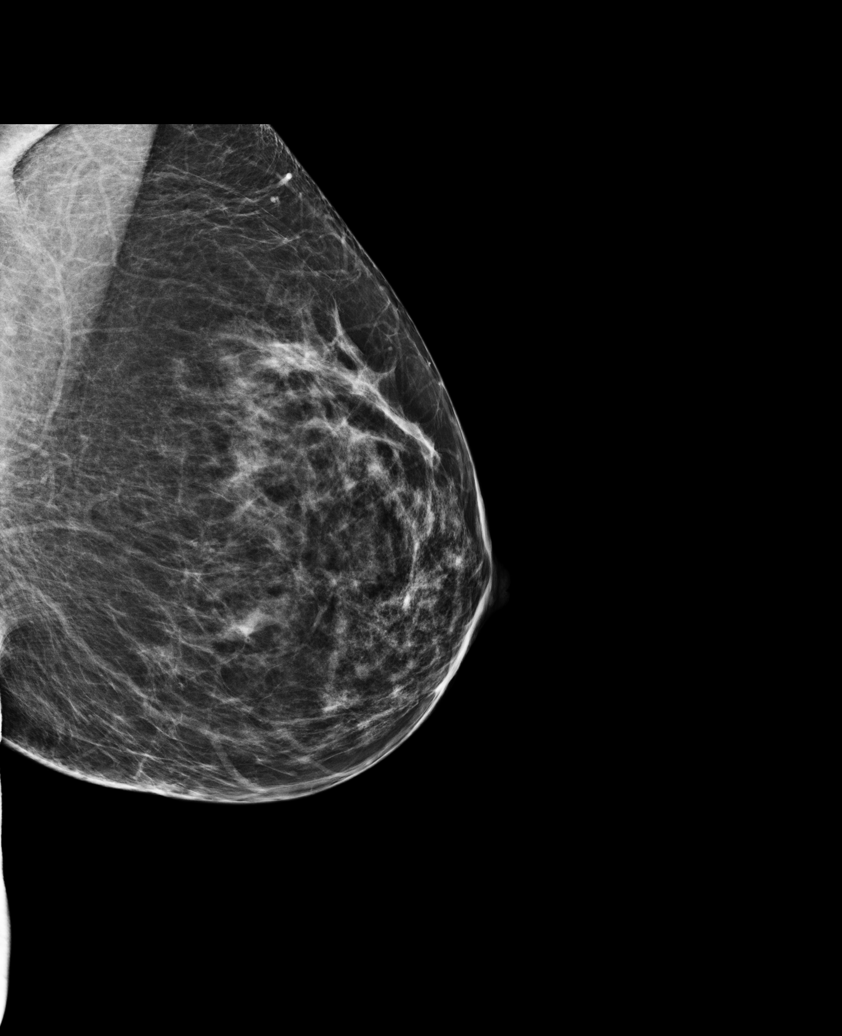

[R CC]
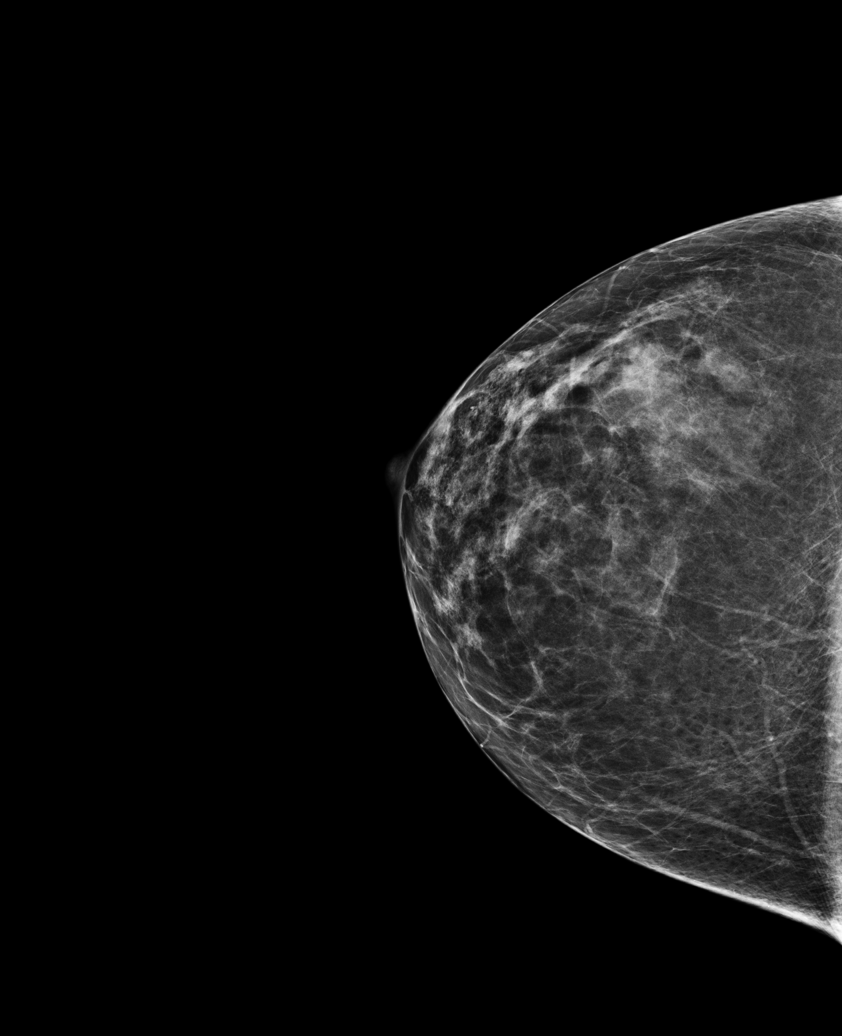

[5 of 5 positions shown; findings below may reference images not displayed]

ACR Breast Density Category b: There are scattered areas of
fibroglandular density.
FINDINGS: There are no findings suspicious for malignancy. Images were
processed with CAD.
IMPRESSION: No mammographic evidence of malignancy. A result letter of this
screening mammogram will be mailed directly to the patient.

RECOMMENDATION:
Screening mammogram in one year. (Code:[US])

BI-RADS CATEGORY  1: Negative.

## 2016-07-12 DIAGNOSIS — M961 Postlaminectomy syndrome, not elsewhere classified: Secondary | ICD-10-CM | POA: Diagnosis not present

## 2016-07-12 DIAGNOSIS — Z79891 Long term (current) use of opiate analgesic: Secondary | ICD-10-CM | POA: Diagnosis not present

## 2016-07-12 DIAGNOSIS — F4321 Adjustment disorder with depressed mood: Secondary | ICD-10-CM | POA: Diagnosis not present

## 2016-07-12 DIAGNOSIS — G894 Chronic pain syndrome: Secondary | ICD-10-CM | POA: Diagnosis not present

## 2016-07-25 DIAGNOSIS — N3010 Interstitial cystitis (chronic) without hematuria: Secondary | ICD-10-CM | POA: Diagnosis not present

## 2016-08-07 DIAGNOSIS — D2239 Melanocytic nevi of other parts of face: Secondary | ICD-10-CM | POA: Diagnosis not present

## 2016-08-07 DIAGNOSIS — L638 Other alopecia areata: Secondary | ICD-10-CM | POA: Diagnosis not present

## 2016-08-08 DIAGNOSIS — D508 Other iron deficiency anemias: Secondary | ICD-10-CM | POA: Diagnosis not present

## 2016-08-08 DIAGNOSIS — R109 Unspecified abdominal pain: Secondary | ICD-10-CM | POA: Diagnosis not present

## 2016-08-08 DIAGNOSIS — R3982 Chronic bladder pain: Secondary | ICD-10-CM | POA: Diagnosis not present

## 2016-08-08 DIAGNOSIS — L638 Other alopecia areata: Secondary | ICD-10-CM | POA: Diagnosis not present

## 2016-08-16 DIAGNOSIS — R1031 Right lower quadrant pain: Secondary | ICD-10-CM | POA: Diagnosis not present

## 2016-08-17 ENCOUNTER — Other Ambulatory Visit: Payer: Self-pay | Admitting: Obstetrics and Gynecology

## 2016-08-17 DIAGNOSIS — R5383 Other fatigue: Secondary | ICD-10-CM | POA: Diagnosis not present

## 2016-08-17 DIAGNOSIS — Z833 Family history of diabetes mellitus: Secondary | ICD-10-CM | POA: Diagnosis not present

## 2016-08-17 DIAGNOSIS — R631 Polydipsia: Secondary | ICD-10-CM | POA: Diagnosis not present

## 2016-08-17 DIAGNOSIS — R1031 Right lower quadrant pain: Secondary | ICD-10-CM

## 2016-08-23 ENCOUNTER — Other Ambulatory Visit: Payer: 59

## 2016-08-29 ENCOUNTER — Ambulatory Visit
Admission: RE | Admit: 2016-08-29 | Discharge: 2016-08-29 | Disposition: A | Discharge status: Home / Self Care | Payer: Medicare Other | Source: Ambulatory Visit | Attending: Obstetrics and Gynecology | Admitting: Obstetrics and Gynecology

## 2016-08-29 DIAGNOSIS — R1031 Right lower quadrant pain: Secondary | ICD-10-CM

## 2016-08-29 DIAGNOSIS — R1030 Lower abdominal pain, unspecified: Secondary | ICD-10-CM | POA: Diagnosis not present

## 2016-08-29 MED ORDER — IOPAMIDOL (ISOVUE-300) INJECTION 61%
100.0000 mL | Freq: Once | INTRAVENOUS | Status: AC | PRN
  Administered 2016-08-29: 100 mL via INTRAVENOUS

## 2016-08-31 DIAGNOSIS — Z79891 Long term (current) use of opiate analgesic: Secondary | ICD-10-CM | POA: Diagnosis not present

## 2016-08-31 DIAGNOSIS — M961 Postlaminectomy syndrome, not elsewhere classified: Secondary | ICD-10-CM | POA: Diagnosis not present

## 2016-08-31 DIAGNOSIS — G894 Chronic pain syndrome: Secondary | ICD-10-CM | POA: Diagnosis not present

## 2016-08-31 DIAGNOSIS — F4321 Adjustment disorder with depressed mood: Secondary | ICD-10-CM | POA: Diagnosis not present

## 2016-09-10 DIAGNOSIS — L720 Epidermal cyst: Secondary | ICD-10-CM | POA: Diagnosis not present

## 2016-09-10 DIAGNOSIS — B351 Tinea unguium: Secondary | ICD-10-CM | POA: Diagnosis not present

## 2016-09-10 DIAGNOSIS — L570 Actinic keratosis: Secondary | ICD-10-CM | POA: Diagnosis not present

## 2016-09-10 DIAGNOSIS — L638 Other alopecia areata: Secondary | ICD-10-CM | POA: Diagnosis not present

## 2016-09-28 DIAGNOSIS — M961 Postlaminectomy syndrome, not elsewhere classified: Secondary | ICD-10-CM | POA: Diagnosis not present

## 2016-09-28 DIAGNOSIS — F4321 Adjustment disorder with depressed mood: Secondary | ICD-10-CM | POA: Diagnosis not present

## 2016-09-28 DIAGNOSIS — G894 Chronic pain syndrome: Secondary | ICD-10-CM | POA: Diagnosis not present

## 2016-09-28 DIAGNOSIS — Z79891 Long term (current) use of opiate analgesic: Secondary | ICD-10-CM | POA: Diagnosis not present

## 2016-10-03 DIAGNOSIS — L720 Epidermal cyst: Secondary | ICD-10-CM | POA: Diagnosis not present

## 2016-10-03 DIAGNOSIS — L729 Follicular cyst of the skin and subcutaneous tissue, unspecified: Secondary | ICD-10-CM | POA: Diagnosis not present

## 2016-10-17 DIAGNOSIS — G4733 Obstructive sleep apnea (adult) (pediatric): Secondary | ICD-10-CM | POA: Diagnosis not present

## 2016-10-26 DIAGNOSIS — Z79891 Long term (current) use of opiate analgesic: Secondary | ICD-10-CM | POA: Diagnosis not present

## 2016-10-26 DIAGNOSIS — F4321 Adjustment disorder with depressed mood: Secondary | ICD-10-CM | POA: Diagnosis not present

## 2016-10-26 DIAGNOSIS — G894 Chronic pain syndrome: Secondary | ICD-10-CM | POA: Diagnosis not present

## 2016-10-26 DIAGNOSIS — M961 Postlaminectomy syndrome, not elsewhere classified: Secondary | ICD-10-CM | POA: Diagnosis not present

## 2016-11-15 DIAGNOSIS — M5413 Radiculopathy, cervicothoracic region: Secondary | ICD-10-CM | POA: Diagnosis not present

## 2016-11-15 DIAGNOSIS — M542 Cervicalgia: Secondary | ICD-10-CM | POA: Diagnosis not present

## 2016-11-23 DIAGNOSIS — M961 Postlaminectomy syndrome, not elsewhere classified: Secondary | ICD-10-CM | POA: Diagnosis not present

## 2016-11-23 DIAGNOSIS — Z79891 Long term (current) use of opiate analgesic: Secondary | ICD-10-CM | POA: Diagnosis not present

## 2016-11-23 DIAGNOSIS — F4321 Adjustment disorder with depressed mood: Secondary | ICD-10-CM | POA: Diagnosis not present

## 2016-11-23 DIAGNOSIS — G894 Chronic pain syndrome: Secondary | ICD-10-CM | POA: Diagnosis not present

## 2016-12-21 DIAGNOSIS — G894 Chronic pain syndrome: Secondary | ICD-10-CM | POA: Diagnosis not present

## 2016-12-21 DIAGNOSIS — Z79891 Long term (current) use of opiate analgesic: Secondary | ICD-10-CM | POA: Diagnosis not present

## 2016-12-21 DIAGNOSIS — M961 Postlaminectomy syndrome, not elsewhere classified: Secondary | ICD-10-CM | POA: Diagnosis not present

## 2016-12-21 DIAGNOSIS — F4321 Adjustment disorder with depressed mood: Secondary | ICD-10-CM | POA: Diagnosis not present

## 2017-01-01 DIAGNOSIS — G4733 Obstructive sleep apnea (adult) (pediatric): Secondary | ICD-10-CM | POA: Diagnosis not present

## 2017-01-02 DIAGNOSIS — B351 Tinea unguium: Secondary | ICD-10-CM | POA: Diagnosis not present

## 2017-01-02 DIAGNOSIS — L638 Other alopecia areata: Secondary | ICD-10-CM | POA: Diagnosis not present

## 2017-01-08 DIAGNOSIS — G4733 Obstructive sleep apnea (adult) (pediatric): Secondary | ICD-10-CM | POA: Diagnosis not present

## 2017-01-11 ENCOUNTER — Ambulatory Visit (INDEPENDENT_AMBULATORY_CARE_PROVIDER_SITE_OTHER): Payer: Self-pay | Admitting: Specialist

## 2017-01-18 DIAGNOSIS — G894 Chronic pain syndrome: Secondary | ICD-10-CM | POA: Diagnosis not present

## 2017-01-18 DIAGNOSIS — M961 Postlaminectomy syndrome, not elsewhere classified: Secondary | ICD-10-CM | POA: Diagnosis not present

## 2017-01-18 DIAGNOSIS — F4321 Adjustment disorder with depressed mood: Secondary | ICD-10-CM | POA: Diagnosis not present

## 2017-01-18 DIAGNOSIS — Z79891 Long term (current) use of opiate analgesic: Secondary | ICD-10-CM | POA: Diagnosis not present

## 2017-03-01 DIAGNOSIS — G894 Chronic pain syndrome: Secondary | ICD-10-CM | POA: Diagnosis not present

## 2017-03-01 DIAGNOSIS — M961 Postlaminectomy syndrome, not elsewhere classified: Secondary | ICD-10-CM | POA: Diagnosis not present

## 2017-03-01 DIAGNOSIS — F4321 Adjustment disorder with depressed mood: Secondary | ICD-10-CM | POA: Diagnosis not present

## 2017-03-01 DIAGNOSIS — Z79891 Long term (current) use of opiate analgesic: Secondary | ICD-10-CM | POA: Diagnosis not present

## 2017-03-18 DIAGNOSIS — G4733 Obstructive sleep apnea (adult) (pediatric): Secondary | ICD-10-CM | POA: Diagnosis not present

## 2017-03-19 DIAGNOSIS — N3010 Interstitial cystitis (chronic) without hematuria: Secondary | ICD-10-CM | POA: Diagnosis not present

## 2017-03-27 DIAGNOSIS — T402X5A Adverse effect of other opioids, initial encounter: Secondary | ICD-10-CM | POA: Diagnosis not present

## 2017-03-27 DIAGNOSIS — K5903 Drug induced constipation: Secondary | ICD-10-CM | POA: Diagnosis not present

## 2017-03-27 DIAGNOSIS — R10813 Right lower quadrant abdominal tenderness: Secondary | ICD-10-CM | POA: Diagnosis not present

## 2017-04-01 DIAGNOSIS — Z01419 Encounter for gynecological examination (general) (routine) without abnormal findings: Secondary | ICD-10-CM | POA: Diagnosis not present

## 2017-04-01 DIAGNOSIS — Z6828 Body mass index (BMI) 28.0-28.9, adult: Secondary | ICD-10-CM | POA: Diagnosis not present

## 2017-04-05 DIAGNOSIS — F4321 Adjustment disorder with depressed mood: Secondary | ICD-10-CM | POA: Diagnosis not present

## 2017-04-05 DIAGNOSIS — G894 Chronic pain syndrome: Secondary | ICD-10-CM | POA: Diagnosis not present

## 2017-04-05 DIAGNOSIS — Z79891 Long term (current) use of opiate analgesic: Secondary | ICD-10-CM | POA: Diagnosis not present

## 2017-04-05 DIAGNOSIS — M961 Postlaminectomy syndrome, not elsewhere classified: Secondary | ICD-10-CM | POA: Diagnosis not present

## 2017-05-17 DIAGNOSIS — Z79891 Long term (current) use of opiate analgesic: Secondary | ICD-10-CM | POA: Diagnosis not present

## 2017-05-17 DIAGNOSIS — M961 Postlaminectomy syndrome, not elsewhere classified: Secondary | ICD-10-CM | POA: Diagnosis not present

## 2017-05-17 DIAGNOSIS — F4321 Adjustment disorder with depressed mood: Secondary | ICD-10-CM | POA: Diagnosis not present

## 2017-05-17 DIAGNOSIS — G894 Chronic pain syndrome: Secondary | ICD-10-CM | POA: Diagnosis not present

## 2017-06-17 DIAGNOSIS — M961 Postlaminectomy syndrome, not elsewhere classified: Secondary | ICD-10-CM | POA: Diagnosis not present

## 2017-06-17 DIAGNOSIS — F4321 Adjustment disorder with depressed mood: Secondary | ICD-10-CM | POA: Diagnosis not present

## 2017-06-17 DIAGNOSIS — Z79891 Long term (current) use of opiate analgesic: Secondary | ICD-10-CM | POA: Diagnosis not present

## 2017-06-17 DIAGNOSIS — G894 Chronic pain syndrome: Secondary | ICD-10-CM | POA: Diagnosis not present

## 2017-07-03 ENCOUNTER — Ambulatory Visit (INDEPENDENT_AMBULATORY_CARE_PROVIDER_SITE_OTHER): Payer: Self-pay | Admitting: Specialist

## 2017-07-09 DIAGNOSIS — G4733 Obstructive sleep apnea (adult) (pediatric): Secondary | ICD-10-CM | POA: Diagnosis not present

## 2017-09-11 DIAGNOSIS — M79674 Pain in right toe(s): Secondary | ICD-10-CM | POA: Diagnosis not present

## 2017-09-11 DIAGNOSIS — M2041 Other hammer toe(s) (acquired), right foot: Secondary | ICD-10-CM | POA: Diagnosis not present

## 2017-09-11 DIAGNOSIS — M2042 Other hammer toe(s) (acquired), left foot: Secondary | ICD-10-CM | POA: Diagnosis not present

## 2017-09-26 DIAGNOSIS — M961 Postlaminectomy syndrome, not elsewhere classified: Secondary | ICD-10-CM | POA: Diagnosis not present

## 2017-09-26 DIAGNOSIS — G894 Chronic pain syndrome: Secondary | ICD-10-CM | POA: Diagnosis not present

## 2017-09-26 DIAGNOSIS — Z79891 Long term (current) use of opiate analgesic: Secondary | ICD-10-CM | POA: Diagnosis not present

## 2017-09-26 DIAGNOSIS — F4321 Adjustment disorder with depressed mood: Secondary | ICD-10-CM | POA: Diagnosis not present

## 2017-11-21 DIAGNOSIS — F4321 Adjustment disorder with depressed mood: Secondary | ICD-10-CM | POA: Diagnosis not present

## 2017-11-21 DIAGNOSIS — G894 Chronic pain syndrome: Secondary | ICD-10-CM | POA: Diagnosis not present

## 2017-11-21 DIAGNOSIS — Z79891 Long term (current) use of opiate analgesic: Secondary | ICD-10-CM | POA: Diagnosis not present

## 2017-11-21 DIAGNOSIS — M961 Postlaminectomy syndrome, not elsewhere classified: Secondary | ICD-10-CM | POA: Diagnosis not present

## 2017-12-12 DIAGNOSIS — G4733 Obstructive sleep apnea (adult) (pediatric): Secondary | ICD-10-CM | POA: Diagnosis not present

## 2017-12-16 DIAGNOSIS — L303 Infective dermatitis: Secondary | ICD-10-CM | POA: Diagnosis not present

## 2017-12-16 DIAGNOSIS — L03031 Cellulitis of right toe: Secondary | ICD-10-CM | POA: Diagnosis not present

## 2017-12-16 DIAGNOSIS — M25774 Osteophyte, right foot: Secondary | ICD-10-CM | POA: Diagnosis not present

## 2017-12-20 DIAGNOSIS — M961 Postlaminectomy syndrome, not elsewhere classified: Secondary | ICD-10-CM | POA: Diagnosis not present

## 2017-12-20 DIAGNOSIS — Z79891 Long term (current) use of opiate analgesic: Secondary | ICD-10-CM | POA: Diagnosis not present

## 2017-12-20 DIAGNOSIS — F4321 Adjustment disorder with depressed mood: Secondary | ICD-10-CM | POA: Diagnosis not present

## 2017-12-20 DIAGNOSIS — G894 Chronic pain syndrome: Secondary | ICD-10-CM | POA: Diagnosis not present

## 2018-01-01 DIAGNOSIS — B351 Tinea unguium: Secondary | ICD-10-CM | POA: Diagnosis not present

## 2018-01-01 DIAGNOSIS — M79675 Pain in left toe(s): Secondary | ICD-10-CM | POA: Diagnosis not present

## 2018-01-01 DIAGNOSIS — M79674 Pain in right toe(s): Secondary | ICD-10-CM | POA: Diagnosis not present

## 2018-01-17 DIAGNOSIS — F4321 Adjustment disorder with depressed mood: Secondary | ICD-10-CM | POA: Diagnosis not present

## 2018-01-17 DIAGNOSIS — M961 Postlaminectomy syndrome, not elsewhere classified: Secondary | ICD-10-CM | POA: Diagnosis not present

## 2018-01-17 DIAGNOSIS — Z79891 Long term (current) use of opiate analgesic: Secondary | ICD-10-CM | POA: Diagnosis not present

## 2018-01-17 DIAGNOSIS — G894 Chronic pain syndrome: Secondary | ICD-10-CM | POA: Diagnosis not present

## 2018-01-29 DIAGNOSIS — B351 Tinea unguium: Secondary | ICD-10-CM | POA: Diagnosis not present

## 2018-01-29 DIAGNOSIS — M79674 Pain in right toe(s): Secondary | ICD-10-CM | POA: Diagnosis not present

## 2018-01-29 DIAGNOSIS — M79675 Pain in left toe(s): Secondary | ICD-10-CM | POA: Diagnosis not present

## 2018-02-17 DIAGNOSIS — F4321 Adjustment disorder with depressed mood: Secondary | ICD-10-CM | POA: Diagnosis not present

## 2018-02-17 DIAGNOSIS — G894 Chronic pain syndrome: Secondary | ICD-10-CM | POA: Diagnosis not present

## 2018-02-17 DIAGNOSIS — Z79891 Long term (current) use of opiate analgesic: Secondary | ICD-10-CM | POA: Diagnosis not present

## 2018-02-17 DIAGNOSIS — M961 Postlaminectomy syndrome, not elsewhere classified: Secondary | ICD-10-CM | POA: Diagnosis not present

## 2018-03-05 DIAGNOSIS — M79675 Pain in left toe(s): Secondary | ICD-10-CM | POA: Diagnosis not present

## 2018-03-05 DIAGNOSIS — M722 Plantar fascial fibromatosis: Secondary | ICD-10-CM | POA: Diagnosis not present

## 2018-03-05 DIAGNOSIS — B351 Tinea unguium: Secondary | ICD-10-CM | POA: Diagnosis not present

## 2018-03-05 DIAGNOSIS — M79674 Pain in right toe(s): Secondary | ICD-10-CM | POA: Diagnosis not present

## 2018-04-03 DIAGNOSIS — M722 Plantar fascial fibromatosis: Secondary | ICD-10-CM | POA: Diagnosis not present

## 2018-04-03 DIAGNOSIS — B351 Tinea unguium: Secondary | ICD-10-CM | POA: Diagnosis not present

## 2018-04-03 DIAGNOSIS — M79675 Pain in left toe(s): Secondary | ICD-10-CM | POA: Diagnosis not present

## 2018-04-03 DIAGNOSIS — M71572 Other bursitis, not elsewhere classified, left ankle and foot: Secondary | ICD-10-CM | POA: Diagnosis not present

## 2018-04-08 DIAGNOSIS — G894 Chronic pain syndrome: Secondary | ICD-10-CM | POA: Diagnosis not present

## 2018-04-08 DIAGNOSIS — M961 Postlaminectomy syndrome, not elsewhere classified: Secondary | ICD-10-CM | POA: Diagnosis not present

## 2018-04-08 DIAGNOSIS — Z79891 Long term (current) use of opiate analgesic: Secondary | ICD-10-CM | POA: Diagnosis not present

## 2018-04-08 DIAGNOSIS — F4321 Adjustment disorder with depressed mood: Secondary | ICD-10-CM | POA: Diagnosis not present

## 2018-04-16 DIAGNOSIS — G4733 Obstructive sleep apnea (adult) (pediatric): Secondary | ICD-10-CM | POA: Diagnosis not present

## 2018-05-09 DIAGNOSIS — G4733 Obstructive sleep apnea (adult) (pediatric): Secondary | ICD-10-CM | POA: Diagnosis not present

## 2018-05-20 DIAGNOSIS — M961 Postlaminectomy syndrome, not elsewhere classified: Secondary | ICD-10-CM | POA: Diagnosis not present

## 2018-05-20 DIAGNOSIS — F4321 Adjustment disorder with depressed mood: Secondary | ICD-10-CM | POA: Diagnosis not present

## 2018-05-20 DIAGNOSIS — Z79891 Long term (current) use of opiate analgesic: Secondary | ICD-10-CM | POA: Diagnosis not present

## 2018-05-20 DIAGNOSIS — G894 Chronic pain syndrome: Secondary | ICD-10-CM | POA: Diagnosis not present

## 2018-06-16 DIAGNOSIS — G4733 Obstructive sleep apnea (adult) (pediatric): Secondary | ICD-10-CM | POA: Diagnosis not present

## 2018-07-07 DIAGNOSIS — M722 Plantar fascial fibromatosis: Secondary | ICD-10-CM | POA: Diagnosis not present

## 2018-07-07 DIAGNOSIS — L600 Ingrowing nail: Secondary | ICD-10-CM | POA: Diagnosis not present

## 2018-07-15 DIAGNOSIS — G894 Chronic pain syndrome: Secondary | ICD-10-CM | POA: Diagnosis not present

## 2018-07-15 DIAGNOSIS — Z79891 Long term (current) use of opiate analgesic: Secondary | ICD-10-CM | POA: Diagnosis not present

## 2018-07-15 DIAGNOSIS — F4321 Adjustment disorder with depressed mood: Secondary | ICD-10-CM | POA: Diagnosis not present

## 2018-07-15 DIAGNOSIS — M961 Postlaminectomy syndrome, not elsewhere classified: Secondary | ICD-10-CM | POA: Diagnosis not present

## 2018-08-11 DIAGNOSIS — R3914 Feeling of incomplete bladder emptying: Secondary | ICD-10-CM | POA: Diagnosis not present

## 2018-08-11 DIAGNOSIS — N3020 Other chronic cystitis without hematuria: Secondary | ICD-10-CM | POA: Diagnosis not present

## 2018-08-11 DIAGNOSIS — N3010 Interstitial cystitis (chronic) without hematuria: Secondary | ICD-10-CM | POA: Diagnosis not present

## 2018-08-26 DIAGNOSIS — F4321 Adjustment disorder with depressed mood: Secondary | ICD-10-CM | POA: Diagnosis not present

## 2018-08-26 DIAGNOSIS — Z79891 Long term (current) use of opiate analgesic: Secondary | ICD-10-CM | POA: Diagnosis not present

## 2018-08-26 DIAGNOSIS — G894 Chronic pain syndrome: Secondary | ICD-10-CM | POA: Diagnosis not present

## 2018-08-26 DIAGNOSIS — M961 Postlaminectomy syndrome, not elsewhere classified: Secondary | ICD-10-CM | POA: Diagnosis not present

## 2018-09-12 DIAGNOSIS — R109 Unspecified abdominal pain: Secondary | ICD-10-CM | POA: Diagnosis not present

## 2018-09-12 DIAGNOSIS — R351 Nocturia: Secondary | ICD-10-CM | POA: Diagnosis not present

## 2018-10-07 DIAGNOSIS — Z79891 Long term (current) use of opiate analgesic: Secondary | ICD-10-CM | POA: Diagnosis not present

## 2018-10-07 DIAGNOSIS — G894 Chronic pain syndrome: Secondary | ICD-10-CM | POA: Diagnosis not present

## 2018-10-07 DIAGNOSIS — M961 Postlaminectomy syndrome, not elsewhere classified: Secondary | ICD-10-CM | POA: Diagnosis not present

## 2018-10-07 DIAGNOSIS — F4321 Adjustment disorder with depressed mood: Secondary | ICD-10-CM | POA: Diagnosis not present

## 2018-10-28 DIAGNOSIS — G4733 Obstructive sleep apnea (adult) (pediatric): Secondary | ICD-10-CM | POA: Diagnosis not present

## 2018-11-19 DIAGNOSIS — G894 Chronic pain syndrome: Secondary | ICD-10-CM | POA: Diagnosis not present

## 2018-11-19 DIAGNOSIS — Z79891 Long term (current) use of opiate analgesic: Secondary | ICD-10-CM | POA: Diagnosis not present

## 2018-11-19 DIAGNOSIS — M961 Postlaminectomy syndrome, not elsewhere classified: Secondary | ICD-10-CM | POA: Diagnosis not present

## 2018-11-19 DIAGNOSIS — F4321 Adjustment disorder with depressed mood: Secondary | ICD-10-CM | POA: Diagnosis not present

## 2019-01-07 DIAGNOSIS — R7303 Prediabetes: Secondary | ICD-10-CM | POA: Diagnosis not present

## 2019-01-07 DIAGNOSIS — Z0000 Encounter for general adult medical examination without abnormal findings: Secondary | ICD-10-CM | POA: Diagnosis not present

## 2019-01-07 DIAGNOSIS — Z136 Encounter for screening for cardiovascular disorders: Secondary | ICD-10-CM | POA: Diagnosis not present

## 2019-01-07 DIAGNOSIS — Z79899 Other long term (current) drug therapy: Secondary | ICD-10-CM | POA: Diagnosis not present

## 2019-01-09 DIAGNOSIS — G4733 Obstructive sleep apnea (adult) (pediatric): Secondary | ICD-10-CM | POA: Diagnosis not present

## 2019-01-12 DIAGNOSIS — F4321 Adjustment disorder with depressed mood: Secondary | ICD-10-CM | POA: Diagnosis not present

## 2019-01-12 DIAGNOSIS — G894 Chronic pain syndrome: Secondary | ICD-10-CM | POA: Diagnosis not present

## 2019-01-12 DIAGNOSIS — M961 Postlaminectomy syndrome, not elsewhere classified: Secondary | ICD-10-CM | POA: Diagnosis not present

## 2019-01-12 DIAGNOSIS — Z79891 Long term (current) use of opiate analgesic: Secondary | ICD-10-CM | POA: Diagnosis not present

## 2019-01-13 DIAGNOSIS — R7303 Prediabetes: Secondary | ICD-10-CM | POA: Diagnosis not present

## 2019-01-13 DIAGNOSIS — Z79899 Other long term (current) drug therapy: Secondary | ICD-10-CM | POA: Diagnosis not present

## 2019-01-13 DIAGNOSIS — Z0000 Encounter for general adult medical examination without abnormal findings: Secondary | ICD-10-CM | POA: Diagnosis not present

## 2019-01-13 DIAGNOSIS — Z136 Encounter for screening for cardiovascular disorders: Secondary | ICD-10-CM | POA: Diagnosis not present

## 2019-02-20 DIAGNOSIS — N3020 Other chronic cystitis without hematuria: Secondary | ICD-10-CM | POA: Diagnosis not present

## 2019-02-20 DIAGNOSIS — R109 Unspecified abdominal pain: Secondary | ICD-10-CM | POA: Diagnosis not present

## 2019-02-23 DIAGNOSIS — M961 Postlaminectomy syndrome, not elsewhere classified: Secondary | ICD-10-CM | POA: Diagnosis not present

## 2019-02-23 DIAGNOSIS — Z79891 Long term (current) use of opiate analgesic: Secondary | ICD-10-CM | POA: Diagnosis not present

## 2019-02-23 DIAGNOSIS — G894 Chronic pain syndrome: Secondary | ICD-10-CM | POA: Diagnosis not present

## 2019-02-23 DIAGNOSIS — F4321 Adjustment disorder with depressed mood: Secondary | ICD-10-CM | POA: Diagnosis not present

## 2019-02-26 DIAGNOSIS — N3941 Urge incontinence: Secondary | ICD-10-CM | POA: Diagnosis not present

## 2019-03-13 DIAGNOSIS — R3914 Feeling of incomplete bladder emptying: Secondary | ICD-10-CM | POA: Diagnosis not present

## 2019-03-13 DIAGNOSIS — R351 Nocturia: Secondary | ICD-10-CM | POA: Diagnosis not present

## 2019-04-06 DIAGNOSIS — G894 Chronic pain syndrome: Secondary | ICD-10-CM | POA: Diagnosis not present

## 2019-04-06 DIAGNOSIS — M961 Postlaminectomy syndrome, not elsewhere classified: Secondary | ICD-10-CM | POA: Diagnosis not present

## 2019-04-06 DIAGNOSIS — F4321 Adjustment disorder with depressed mood: Secondary | ICD-10-CM | POA: Diagnosis not present

## 2019-04-06 DIAGNOSIS — Z79891 Long term (current) use of opiate analgesic: Secondary | ICD-10-CM | POA: Diagnosis not present

## 2019-04-20 DIAGNOSIS — G4733 Obstructive sleep apnea (adult) (pediatric): Secondary | ICD-10-CM | POA: Diagnosis not present

## 2019-05-04 DIAGNOSIS — L280 Lichen simplex chronicus: Secondary | ICD-10-CM | POA: Diagnosis not present

## 2019-05-04 DIAGNOSIS — D1801 Hemangioma of skin and subcutaneous tissue: Secondary | ICD-10-CM | POA: Diagnosis not present

## 2019-05-04 DIAGNOSIS — L821 Other seborrheic keratosis: Secondary | ICD-10-CM | POA: Diagnosis not present

## 2019-05-05 DIAGNOSIS — M961 Postlaminectomy syndrome, not elsewhere classified: Secondary | ICD-10-CM | POA: Diagnosis not present

## 2019-05-05 DIAGNOSIS — F4321 Adjustment disorder with depressed mood: Secondary | ICD-10-CM | POA: Diagnosis not present

## 2019-05-05 DIAGNOSIS — G894 Chronic pain syndrome: Secondary | ICD-10-CM | POA: Diagnosis not present

## 2019-05-05 DIAGNOSIS — Z79891 Long term (current) use of opiate analgesic: Secondary | ICD-10-CM | POA: Diagnosis not present

## 2019-06-02 DIAGNOSIS — M961 Postlaminectomy syndrome, not elsewhere classified: Secondary | ICD-10-CM | POA: Diagnosis not present

## 2019-06-02 DIAGNOSIS — F4321 Adjustment disorder with depressed mood: Secondary | ICD-10-CM | POA: Diagnosis not present

## 2019-06-02 DIAGNOSIS — Z79891 Long term (current) use of opiate analgesic: Secondary | ICD-10-CM | POA: Diagnosis not present

## 2019-06-02 DIAGNOSIS — G894 Chronic pain syndrome: Secondary | ICD-10-CM | POA: Diagnosis not present

## 2019-06-30 DIAGNOSIS — Z79891 Long term (current) use of opiate analgesic: Secondary | ICD-10-CM | POA: Diagnosis not present

## 2019-06-30 DIAGNOSIS — M961 Postlaminectomy syndrome, not elsewhere classified: Secondary | ICD-10-CM | POA: Diagnosis not present

## 2019-06-30 DIAGNOSIS — F4321 Adjustment disorder with depressed mood: Secondary | ICD-10-CM | POA: Diagnosis not present

## 2019-06-30 DIAGNOSIS — G894 Chronic pain syndrome: Secondary | ICD-10-CM | POA: Diagnosis not present

## 2019-07-08 DIAGNOSIS — G4733 Obstructive sleep apnea (adult) (pediatric): Secondary | ICD-10-CM | POA: Diagnosis not present

## 2019-07-28 DIAGNOSIS — Z79891 Long term (current) use of opiate analgesic: Secondary | ICD-10-CM | POA: Diagnosis not present

## 2019-07-28 DIAGNOSIS — G894 Chronic pain syndrome: Secondary | ICD-10-CM | POA: Diagnosis not present

## 2019-07-28 DIAGNOSIS — M961 Postlaminectomy syndrome, not elsewhere classified: Secondary | ICD-10-CM | POA: Diagnosis not present

## 2019-07-28 DIAGNOSIS — F4321 Adjustment disorder with depressed mood: Secondary | ICD-10-CM | POA: Diagnosis not present

## 2019-08-05 DIAGNOSIS — G4733 Obstructive sleep apnea (adult) (pediatric): Secondary | ICD-10-CM | POA: Diagnosis not present

## 2019-08-25 ENCOUNTER — Ambulatory Visit: Payer: Medicare Other

## 2019-08-26 DIAGNOSIS — G4733 Obstructive sleep apnea (adult) (pediatric): Secondary | ICD-10-CM | POA: Diagnosis not present

## 2019-08-28 ENCOUNTER — Ambulatory Visit: Payer: Medicare Other | Attending: Internal Medicine

## 2019-08-28 DIAGNOSIS — Z23 Encounter for immunization: Secondary | ICD-10-CM

## 2019-08-28 NOTE — Progress Notes (Signed)
   Covid-19 Vaccination Clinic  Name:  Melody Huerta    MRN: [id number] DOB: June 12, 1951  08/28/2019  [name] was observed post Covid-19 immunization for 15 minutes without incidence. She was provided with Vaccine Information Sheet and instruction to access the V-Safe system.   [name] was instructed to call 911 with any severe reactions post vaccine: Marland Kitchen Difficulty breathing  . Swelling of your face and throat  . A fast heartbeat  . A bad rash all over your body  . Dizziness and weakness    Immunizations Administered    Name Date Dose VIS Date Route   Pfizer COVID-19 Vaccine 08/28/2019 12:00 PM 0.3 mL 07/17/2019 Intramuscular   Manufacturer: [employer], [employer]   Lot: [id number]   NDC: [address]-9

## 2019-09-08 DIAGNOSIS — G894 Chronic pain syndrome: Secondary | ICD-10-CM | POA: Diagnosis not present

## 2019-09-08 DIAGNOSIS — F4321 Adjustment disorder with depressed mood: Secondary | ICD-10-CM | POA: Diagnosis not present

## 2019-09-08 DIAGNOSIS — M961 Postlaminectomy syndrome, not elsewhere classified: Secondary | ICD-10-CM | POA: Diagnosis not present

## 2019-09-08 DIAGNOSIS — Z79891 Long term (current) use of opiate analgesic: Secondary | ICD-10-CM | POA: Diagnosis not present

## 2019-09-18 ENCOUNTER — Ambulatory Visit: Payer: Medicare Other | Attending: Internal Medicine

## 2019-09-18 DIAGNOSIS — Z23 Encounter for immunization: Secondary | ICD-10-CM

## 2019-09-18 NOTE — Progress Notes (Signed)
   Covid-19 Vaccination Clinic  Name:  Melody Huerta    MRN: [id number] DOB: 01-26-1951  09/18/2019  Melody Huerta was observed post Covid-19 immunization for 15 minutes without incidence. She was provided with Vaccine Information Sheet and instruction to access the V-Safe system.   Melody Huerta was instructed to call 911 with any severe reactions post vaccine: Marland Kitchen Difficulty breathing  . Swelling of your face and throat  . A fast heartbeat  . A bad rash all over your body  . Dizziness and weakness    Immunizations Administered    Name Date Dose VIS Date Route   Pfizer COVID-19 Vaccine 09/18/2019 10:29 AM 0.3 mL 07/17/2019 Intramuscular   Manufacturer: [employer], [employer]   Lot: [id number]   NDC: [address]-0

## 2019-09-29 DIAGNOSIS — R300 Dysuria: Secondary | ICD-10-CM | POA: Diagnosis not present

## 2019-09-29 DIAGNOSIS — N3001 Acute cystitis with hematuria: Secondary | ICD-10-CM | POA: Diagnosis not present

## 2019-10-20 DIAGNOSIS — M961 Postlaminectomy syndrome, not elsewhere classified: Secondary | ICD-10-CM | POA: Diagnosis not present

## 2019-10-20 DIAGNOSIS — F4321 Adjustment disorder with depressed mood: Secondary | ICD-10-CM | POA: Diagnosis not present

## 2019-10-20 DIAGNOSIS — G894 Chronic pain syndrome: Secondary | ICD-10-CM | POA: Diagnosis not present

## 2019-10-20 DIAGNOSIS — Z79891 Long term (current) use of opiate analgesic: Secondary | ICD-10-CM | POA: Diagnosis not present

## 2019-11-09 DIAGNOSIS — G4733 Obstructive sleep apnea (adult) (pediatric): Secondary | ICD-10-CM | POA: Diagnosis not present

## 2019-11-30 DIAGNOSIS — M961 Postlaminectomy syndrome, not elsewhere classified: Secondary | ICD-10-CM | POA: Diagnosis not present

## 2019-11-30 DIAGNOSIS — Z79891 Long term (current) use of opiate analgesic: Secondary | ICD-10-CM | POA: Diagnosis not present

## 2019-11-30 DIAGNOSIS — G894 Chronic pain syndrome: Secondary | ICD-10-CM | POA: Diagnosis not present

## 2019-11-30 DIAGNOSIS — F4321 Adjustment disorder with depressed mood: Secondary | ICD-10-CM | POA: Diagnosis not present

## 2020-01-04 DIAGNOSIS — G4733 Obstructive sleep apnea (adult) (pediatric): Secondary | ICD-10-CM | POA: Diagnosis not present

## 2020-01-11 DIAGNOSIS — Z79891 Long term (current) use of opiate analgesic: Secondary | ICD-10-CM | POA: Diagnosis not present

## 2020-01-11 DIAGNOSIS — G894 Chronic pain syndrome: Secondary | ICD-10-CM | POA: Diagnosis not present

## 2020-01-11 DIAGNOSIS — F4321 Adjustment disorder with depressed mood: Secondary | ICD-10-CM | POA: Diagnosis not present

## 2020-01-11 DIAGNOSIS — M961 Postlaminectomy syndrome, not elsewhere classified: Secondary | ICD-10-CM | POA: Diagnosis not present

## 2020-02-22 DIAGNOSIS — Z79891 Long term (current) use of opiate analgesic: Secondary | ICD-10-CM | POA: Diagnosis not present

## 2020-02-22 DIAGNOSIS — F4321 Adjustment disorder with depressed mood: Secondary | ICD-10-CM | POA: Diagnosis not present

## 2020-02-22 DIAGNOSIS — G894 Chronic pain syndrome: Secondary | ICD-10-CM | POA: Diagnosis not present

## 2020-02-22 DIAGNOSIS — M961 Postlaminectomy syndrome, not elsewhere classified: Secondary | ICD-10-CM | POA: Diagnosis not present

## 2020-04-04 DIAGNOSIS — G894 Chronic pain syndrome: Secondary | ICD-10-CM | POA: Diagnosis not present

## 2020-04-04 DIAGNOSIS — M961 Postlaminectomy syndrome, not elsewhere classified: Secondary | ICD-10-CM | POA: Diagnosis not present

## 2020-04-04 DIAGNOSIS — Z79891 Long term (current) use of opiate analgesic: Secondary | ICD-10-CM | POA: Diagnosis not present

## 2020-04-04 DIAGNOSIS — F4321 Adjustment disorder with depressed mood: Secondary | ICD-10-CM | POA: Diagnosis not present

## 2020-05-04 DIAGNOSIS — D1801 Hemangioma of skin and subcutaneous tissue: Secondary | ICD-10-CM | POA: Diagnosis not present

## 2020-05-04 DIAGNOSIS — D2271 Melanocytic nevi of right lower limb, including hip: Secondary | ICD-10-CM | POA: Diagnosis not present

## 2020-05-04 DIAGNOSIS — L304 Erythema intertrigo: Secondary | ICD-10-CM | POA: Diagnosis not present

## 2020-05-04 DIAGNOSIS — L821 Other seborrheic keratosis: Secondary | ICD-10-CM | POA: Diagnosis not present

## 2020-05-16 DIAGNOSIS — Z79891 Long term (current) use of opiate analgesic: Secondary | ICD-10-CM | POA: Diagnosis not present

## 2020-05-16 DIAGNOSIS — F4321 Adjustment disorder with depressed mood: Secondary | ICD-10-CM | POA: Diagnosis not present

## 2020-05-16 DIAGNOSIS — G894 Chronic pain syndrome: Secondary | ICD-10-CM | POA: Diagnosis not present

## 2020-05-16 DIAGNOSIS — M961 Postlaminectomy syndrome, not elsewhere classified: Secondary | ICD-10-CM | POA: Diagnosis not present

## 2020-06-02 DIAGNOSIS — G4733 Obstructive sleep apnea (adult) (pediatric): Secondary | ICD-10-CM | POA: Diagnosis not present

## 2020-06-27 DIAGNOSIS — F4321 Adjustment disorder with depressed mood: Secondary | ICD-10-CM | POA: Diagnosis not present

## 2020-06-27 DIAGNOSIS — Z79891 Long term (current) use of opiate analgesic: Secondary | ICD-10-CM | POA: Diagnosis not present

## 2020-06-27 DIAGNOSIS — M961 Postlaminectomy syndrome, not elsewhere classified: Secondary | ICD-10-CM | POA: Diagnosis not present

## 2020-06-27 DIAGNOSIS — G894 Chronic pain syndrome: Secondary | ICD-10-CM | POA: Diagnosis not present

## 2020-07-02 DIAGNOSIS — G4733 Obstructive sleep apnea (adult) (pediatric): Secondary | ICD-10-CM | POA: Diagnosis not present

## 2020-08-11 DIAGNOSIS — R3914 Feeling of incomplete bladder emptying: Secondary | ICD-10-CM | POA: Diagnosis not present

## 2020-08-11 DIAGNOSIS — R3912 Poor urinary stream: Secondary | ICD-10-CM | POA: Diagnosis not present

## 2020-08-24 DIAGNOSIS — F4321 Adjustment disorder with depressed mood: Secondary | ICD-10-CM | POA: Diagnosis not present

## 2020-08-24 DIAGNOSIS — Z79891 Long term (current) use of opiate analgesic: Secondary | ICD-10-CM | POA: Diagnosis not present

## 2020-08-24 DIAGNOSIS — M961 Postlaminectomy syndrome, not elsewhere classified: Secondary | ICD-10-CM | POA: Diagnosis not present

## 2020-08-24 DIAGNOSIS — G894 Chronic pain syndrome: Secondary | ICD-10-CM | POA: Diagnosis not present

## 2020-09-21 DIAGNOSIS — M961 Postlaminectomy syndrome, not elsewhere classified: Secondary | ICD-10-CM | POA: Diagnosis not present

## 2020-09-21 DIAGNOSIS — Z79891 Long term (current) use of opiate analgesic: Secondary | ICD-10-CM | POA: Diagnosis not present

## 2020-09-21 DIAGNOSIS — G894 Chronic pain syndrome: Secondary | ICD-10-CM | POA: Diagnosis not present

## 2020-09-21 DIAGNOSIS — F4321 Adjustment disorder with depressed mood: Secondary | ICD-10-CM | POA: Diagnosis not present

## 2020-09-28 DIAGNOSIS — G4733 Obstructive sleep apnea (adult) (pediatric): Secondary | ICD-10-CM | POA: Diagnosis not present

## 2020-10-10 DIAGNOSIS — G4733 Obstructive sleep apnea (adult) (pediatric): Secondary | ICD-10-CM | POA: Diagnosis not present

## 2020-10-26 DIAGNOSIS — Z79891 Long term (current) use of opiate analgesic: Secondary | ICD-10-CM | POA: Diagnosis not present

## 2020-10-26 DIAGNOSIS — G894 Chronic pain syndrome: Secondary | ICD-10-CM | POA: Diagnosis not present

## 2020-10-26 DIAGNOSIS — F4321 Adjustment disorder with depressed mood: Secondary | ICD-10-CM | POA: Diagnosis not present

## 2020-10-26 DIAGNOSIS — M961 Postlaminectomy syndrome, not elsewhere classified: Secondary | ICD-10-CM | POA: Diagnosis not present

## 2020-11-23 DIAGNOSIS — F4321 Adjustment disorder with depressed mood: Secondary | ICD-10-CM | POA: Diagnosis not present

## 2020-11-23 DIAGNOSIS — Z79891 Long term (current) use of opiate analgesic: Secondary | ICD-10-CM | POA: Diagnosis not present

## 2020-11-23 DIAGNOSIS — M961 Postlaminectomy syndrome, not elsewhere classified: Secondary | ICD-10-CM | POA: Diagnosis not present

## 2020-11-23 DIAGNOSIS — G894 Chronic pain syndrome: Secondary | ICD-10-CM | POA: Diagnosis not present

## 2020-12-06 DIAGNOSIS — G8929 Other chronic pain: Secondary | ICD-10-CM | POA: Diagnosis not present

## 2020-12-06 DIAGNOSIS — E785 Hyperlipidemia, unspecified: Secondary | ICD-10-CM | POA: Diagnosis not present

## 2020-12-06 DIAGNOSIS — Z0000 Encounter for general adult medical examination without abnormal findings: Secondary | ICD-10-CM | POA: Diagnosis not present

## 2020-12-06 DIAGNOSIS — F322 Major depressive disorder, single episode, severe without psychotic features: Secondary | ICD-10-CM | POA: Diagnosis not present

## 2020-12-21 DIAGNOSIS — M961 Postlaminectomy syndrome, not elsewhere classified: Secondary | ICD-10-CM | POA: Diagnosis not present

## 2020-12-21 DIAGNOSIS — G894 Chronic pain syndrome: Secondary | ICD-10-CM | POA: Diagnosis not present

## 2020-12-21 DIAGNOSIS — M47812 Spondylosis without myelopathy or radiculopathy, cervical region: Secondary | ICD-10-CM | POA: Diagnosis not present

## 2020-12-21 DIAGNOSIS — Z79891 Long term (current) use of opiate analgesic: Secondary | ICD-10-CM | POA: Diagnosis not present

## 2020-12-29 ENCOUNTER — Other Ambulatory Visit: Payer: Self-pay | Admitting: Family Medicine

## 2020-12-29 DIAGNOSIS — Z139 Encounter for screening, unspecified: Secondary | ICD-10-CM

## 2020-12-30 ENCOUNTER — Ambulatory Visit
Admission: RE | Admit: 2020-12-30 | Discharge: 2020-12-30 | Disposition: A | Discharge status: Home / Self Care | Payer: Medicare Other | Source: Ambulatory Visit | Attending: Family Medicine | Admitting: Family Medicine

## 2020-12-30 ENCOUNTER — Other Ambulatory Visit: Payer: Self-pay

## 2020-12-30 DIAGNOSIS — Z139 Encounter for screening, unspecified: Secondary | ICD-10-CM

## 2020-12-30 DIAGNOSIS — Z1231 Encounter for screening mammogram for malignant neoplasm of breast: Secondary | ICD-10-CM | POA: Diagnosis not present

## 2021-01-09 DIAGNOSIS — G4733 Obstructive sleep apnea (adult) (pediatric): Secondary | ICD-10-CM | POA: Diagnosis not present

## 2021-01-18 DIAGNOSIS — M47812 Spondylosis without myelopathy or radiculopathy, cervical region: Secondary | ICD-10-CM | POA: Diagnosis not present

## 2021-01-18 DIAGNOSIS — M961 Postlaminectomy syndrome, not elsewhere classified: Secondary | ICD-10-CM | POA: Diagnosis not present

## 2021-01-18 DIAGNOSIS — G894 Chronic pain syndrome: Secondary | ICD-10-CM | POA: Diagnosis not present

## 2021-01-18 DIAGNOSIS — Z79891 Long term (current) use of opiate analgesic: Secondary | ICD-10-CM | POA: Diagnosis not present

## 2021-02-22 DIAGNOSIS — M47812 Spondylosis without myelopathy or radiculopathy, cervical region: Secondary | ICD-10-CM | POA: Diagnosis not present

## 2021-02-22 DIAGNOSIS — G894 Chronic pain syndrome: Secondary | ICD-10-CM | POA: Diagnosis not present

## 2021-02-22 DIAGNOSIS — Z79891 Long term (current) use of opiate analgesic: Secondary | ICD-10-CM | POA: Diagnosis not present

## 2021-02-22 DIAGNOSIS — M961 Postlaminectomy syndrome, not elsewhere classified: Secondary | ICD-10-CM | POA: Diagnosis not present

## 2021-03-22 DIAGNOSIS — M961 Postlaminectomy syndrome, not elsewhere classified: Secondary | ICD-10-CM | POA: Diagnosis not present

## 2021-03-22 DIAGNOSIS — M47812 Spondylosis without myelopathy or radiculopathy, cervical region: Secondary | ICD-10-CM | POA: Diagnosis not present

## 2021-03-22 DIAGNOSIS — Z79891 Long term (current) use of opiate analgesic: Secondary | ICD-10-CM | POA: Diagnosis not present

## 2021-03-22 DIAGNOSIS — G894 Chronic pain syndrome: Secondary | ICD-10-CM | POA: Diagnosis not present

## 2021-04-19 DIAGNOSIS — Z79891 Long term (current) use of opiate analgesic: Secondary | ICD-10-CM | POA: Diagnosis not present

## 2021-04-19 DIAGNOSIS — M961 Postlaminectomy syndrome, not elsewhere classified: Secondary | ICD-10-CM | POA: Diagnosis not present

## 2021-04-19 DIAGNOSIS — G894 Chronic pain syndrome: Secondary | ICD-10-CM | POA: Diagnosis not present

## 2021-04-19 DIAGNOSIS — M47812 Spondylosis without myelopathy or radiculopathy, cervical region: Secondary | ICD-10-CM | POA: Diagnosis not present

## 2021-05-01 DIAGNOSIS — Z1211 Encounter for screening for malignant neoplasm of colon: Secondary | ICD-10-CM | POA: Diagnosis not present

## 2021-05-15 DIAGNOSIS — D1801 Hemangioma of skin and subcutaneous tissue: Secondary | ICD-10-CM | POA: Diagnosis not present

## 2021-05-15 DIAGNOSIS — L821 Other seborrheic keratosis: Secondary | ICD-10-CM | POA: Diagnosis not present

## 2021-05-17 DIAGNOSIS — G894 Chronic pain syndrome: Secondary | ICD-10-CM | POA: Diagnosis not present

## 2021-05-17 DIAGNOSIS — M961 Postlaminectomy syndrome, not elsewhere classified: Secondary | ICD-10-CM | POA: Diagnosis not present

## 2021-05-17 DIAGNOSIS — Z79891 Long term (current) use of opiate analgesic: Secondary | ICD-10-CM | POA: Diagnosis not present

## 2021-05-17 DIAGNOSIS — M47812 Spondylosis without myelopathy or radiculopathy, cervical region: Secondary | ICD-10-CM | POA: Diagnosis not present

## 2021-05-22 DIAGNOSIS — G4733 Obstructive sleep apnea (adult) (pediatric): Secondary | ICD-10-CM | POA: Diagnosis not present

## 2021-06-15 DIAGNOSIS — Z79891 Long term (current) use of opiate analgesic: Secondary | ICD-10-CM | POA: Diagnosis not present

## 2021-06-15 DIAGNOSIS — M961 Postlaminectomy syndrome, not elsewhere classified: Secondary | ICD-10-CM | POA: Diagnosis not present

## 2021-06-15 DIAGNOSIS — M47812 Spondylosis without myelopathy or radiculopathy, cervical region: Secondary | ICD-10-CM | POA: Diagnosis not present

## 2021-06-15 DIAGNOSIS — G894 Chronic pain syndrome: Secondary | ICD-10-CM | POA: Diagnosis not present

## 2021-06-22 DIAGNOSIS — G4733 Obstructive sleep apnea (adult) (pediatric): Secondary | ICD-10-CM | POA: Diagnosis not present

## 2021-07-13 DIAGNOSIS — Z79891 Long term (current) use of opiate analgesic: Secondary | ICD-10-CM | POA: Diagnosis not present

## 2021-07-13 DIAGNOSIS — M961 Postlaminectomy syndrome, not elsewhere classified: Secondary | ICD-10-CM | POA: Diagnosis not present

## 2021-07-13 DIAGNOSIS — M47812 Spondylosis without myelopathy or radiculopathy, cervical region: Secondary | ICD-10-CM | POA: Diagnosis not present

## 2021-07-13 DIAGNOSIS — G894 Chronic pain syndrome: Secondary | ICD-10-CM | POA: Diagnosis not present

## 2021-07-22 DIAGNOSIS — G4733 Obstructive sleep apnea (adult) (pediatric): Secondary | ICD-10-CM | POA: Diagnosis not present

## 2021-08-11 DIAGNOSIS — Z79891 Long term (current) use of opiate analgesic: Secondary | ICD-10-CM | POA: Diagnosis not present

## 2021-08-11 DIAGNOSIS — M961 Postlaminectomy syndrome, not elsewhere classified: Secondary | ICD-10-CM | POA: Diagnosis not present

## 2021-08-11 DIAGNOSIS — M47812 Spondylosis without myelopathy or radiculopathy, cervical region: Secondary | ICD-10-CM | POA: Diagnosis not present

## 2021-08-11 DIAGNOSIS — G894 Chronic pain syndrome: Secondary | ICD-10-CM | POA: Diagnosis not present

## 2021-08-24 DIAGNOSIS — G4733 Obstructive sleep apnea (adult) (pediatric): Secondary | ICD-10-CM | POA: Diagnosis not present

## 2021-08-25 DIAGNOSIS — R109 Unspecified abdominal pain: Secondary | ICD-10-CM | POA: Diagnosis not present

## 2021-08-25 DIAGNOSIS — N3020 Other chronic cystitis without hematuria: Secondary | ICD-10-CM | POA: Diagnosis not present

## 2021-09-11 DIAGNOSIS — G894 Chronic pain syndrome: Secondary | ICD-10-CM | POA: Diagnosis not present

## 2021-09-11 DIAGNOSIS — M47812 Spondylosis without myelopathy or radiculopathy, cervical region: Secondary | ICD-10-CM | POA: Diagnosis not present

## 2021-09-11 DIAGNOSIS — M961 Postlaminectomy syndrome, not elsewhere classified: Secondary | ICD-10-CM | POA: Diagnosis not present

## 2021-09-11 DIAGNOSIS — Z79891 Long term (current) use of opiate analgesic: Secondary | ICD-10-CM | POA: Diagnosis not present

## 2021-09-24 DIAGNOSIS — G4733 Obstructive sleep apnea (adult) (pediatric): Secondary | ICD-10-CM | POA: Diagnosis not present

## 2021-10-04 DIAGNOSIS — G4733 Obstructive sleep apnea (adult) (pediatric): Secondary | ICD-10-CM | POA: Diagnosis not present

## 2021-10-09 DIAGNOSIS — M47812 Spondylosis without myelopathy or radiculopathy, cervical region: Secondary | ICD-10-CM | POA: Diagnosis not present

## 2021-10-09 DIAGNOSIS — M961 Postlaminectomy syndrome, not elsewhere classified: Secondary | ICD-10-CM | POA: Diagnosis not present

## 2021-10-09 DIAGNOSIS — Z79891 Long term (current) use of opiate analgesic: Secondary | ICD-10-CM | POA: Diagnosis not present

## 2021-10-09 DIAGNOSIS — G894 Chronic pain syndrome: Secondary | ICD-10-CM | POA: Diagnosis not present

## 2021-11-07 DIAGNOSIS — G894 Chronic pain syndrome: Secondary | ICD-10-CM | POA: Diagnosis not present

## 2021-11-07 DIAGNOSIS — Z79891 Long term (current) use of opiate analgesic: Secondary | ICD-10-CM | POA: Diagnosis not present

## 2021-11-07 DIAGNOSIS — M47812 Spondylosis without myelopathy or radiculopathy, cervical region: Secondary | ICD-10-CM | POA: Diagnosis not present

## 2021-11-07 DIAGNOSIS — M961 Postlaminectomy syndrome, not elsewhere classified: Secondary | ICD-10-CM | POA: Diagnosis not present

## 2021-12-04 DIAGNOSIS — G894 Chronic pain syndrome: Secondary | ICD-10-CM | POA: Diagnosis not present

## 2021-12-04 DIAGNOSIS — Z79891 Long term (current) use of opiate analgesic: Secondary | ICD-10-CM | POA: Diagnosis not present

## 2021-12-04 DIAGNOSIS — M961 Postlaminectomy syndrome, not elsewhere classified: Secondary | ICD-10-CM | POA: Diagnosis not present

## 2021-12-04 DIAGNOSIS — M47812 Spondylosis without myelopathy or radiculopathy, cervical region: Secondary | ICD-10-CM | POA: Diagnosis not present

## 2021-12-11 DIAGNOSIS — E785 Hyperlipidemia, unspecified: Secondary | ICD-10-CM | POA: Diagnosis not present

## 2021-12-11 DIAGNOSIS — G8929 Other chronic pain: Secondary | ICD-10-CM | POA: Diagnosis not present

## 2021-12-11 DIAGNOSIS — F322 Major depressive disorder, single episode, severe without psychotic features: Secondary | ICD-10-CM | POA: Diagnosis not present

## 2021-12-11 DIAGNOSIS — I10 Essential (primary) hypertension: Secondary | ICD-10-CM | POA: Diagnosis not present

## 2021-12-12 DIAGNOSIS — R7303 Prediabetes: Secondary | ICD-10-CM | POA: Diagnosis not present

## 2021-12-28 ENCOUNTER — Other Ambulatory Visit: Payer: Self-pay | Admitting: Family Medicine

## 2021-12-28 ENCOUNTER — Other Ambulatory Visit: Payer: Self-pay

## 2021-12-28 DIAGNOSIS — Z139 Encounter for screening, unspecified: Secondary | ICD-10-CM

## 2022-01-02 ENCOUNTER — Other Ambulatory Visit: Payer: Self-pay | Admitting: Family Medicine

## 2022-01-02 DIAGNOSIS — Z139 Encounter for screening, unspecified: Secondary | ICD-10-CM

## 2022-01-03 DIAGNOSIS — M961 Postlaminectomy syndrome, not elsewhere classified: Secondary | ICD-10-CM | POA: Diagnosis not present

## 2022-01-03 DIAGNOSIS — Z79891 Long term (current) use of opiate analgesic: Secondary | ICD-10-CM | POA: Diagnosis not present

## 2022-01-03 DIAGNOSIS — M47812 Spondylosis without myelopathy or radiculopathy, cervical region: Secondary | ICD-10-CM | POA: Diagnosis not present

## 2022-01-03 DIAGNOSIS — G894 Chronic pain syndrome: Secondary | ICD-10-CM | POA: Diagnosis not present

## 2022-01-29 ENCOUNTER — Ambulatory Visit
Admission: RE | Admit: 2022-01-29 | Discharge: 2022-01-29 | Disposition: A | Discharge status: Home / Self Care | Payer: Medicare Other | Source: Ambulatory Visit | Attending: Family Medicine | Admitting: Family Medicine

## 2022-01-29 DIAGNOSIS — Z139 Encounter for screening, unspecified: Secondary | ICD-10-CM

## 2022-01-29 DIAGNOSIS — Z1231 Encounter for screening mammogram for malignant neoplasm of breast: Secondary | ICD-10-CM | POA: Diagnosis not present

## 2022-01-31 DIAGNOSIS — Z79891 Long term (current) use of opiate analgesic: Secondary | ICD-10-CM | POA: Diagnosis not present

## 2022-01-31 DIAGNOSIS — M961 Postlaminectomy syndrome, not elsewhere classified: Secondary | ICD-10-CM | POA: Diagnosis not present

## 2022-01-31 DIAGNOSIS — G894 Chronic pain syndrome: Secondary | ICD-10-CM | POA: Diagnosis not present

## 2022-01-31 DIAGNOSIS — M47812 Spondylosis without myelopathy or radiculopathy, cervical region: Secondary | ICD-10-CM | POA: Diagnosis not present

## 2022-02-22 DIAGNOSIS — G4733 Obstructive sleep apnea (adult) (pediatric): Secondary | ICD-10-CM | POA: Diagnosis not present

## 2022-02-28 ENCOUNTER — Observation Stay (HOSPITAL_COMMUNITY)
Admission: EM | Admit: 2022-02-28 | Discharge: 2022-03-01 | Disposition: A | Discharge status: Home / Self Care | Payer: Medicare Other | Attending: Emergency Medicine | Admitting: Emergency Medicine

## 2022-02-28 ENCOUNTER — Emergency Department (HOSPITAL_COMMUNITY): Payer: Medicare Other

## 2022-02-28 ENCOUNTER — Other Ambulatory Visit: Payer: Self-pay

## 2022-02-28 ENCOUNTER — Encounter (HOSPITAL_COMMUNITY): Payer: Self-pay | Admitting: Certified Registered Nurse Anesthetist

## 2022-02-28 ENCOUNTER — Encounter (HOSPITAL_COMMUNITY): Payer: Self-pay

## 2022-02-28 DIAGNOSIS — R0789 Other chest pain: Secondary | ICD-10-CM | POA: Diagnosis present

## 2022-02-28 DIAGNOSIS — Z79899 Other long term (current) drug therapy: Secondary | ICD-10-CM | POA: Diagnosis not present

## 2022-02-28 DIAGNOSIS — I4891 Unspecified atrial fibrillation: Secondary | ICD-10-CM | POA: Diagnosis not present

## 2022-02-28 DIAGNOSIS — M960 Pseudarthrosis after fusion or arthrodesis: Secondary | ICD-10-CM | POA: Insufficient documentation

## 2022-02-28 DIAGNOSIS — S129XXA Fracture of neck, unspecified, initial encounter: Secondary | ICD-10-CM | POA: Diagnosis present

## 2022-02-28 DIAGNOSIS — M47814 Spondylosis without myelopathy or radiculopathy, thoracic region: Secondary | ICD-10-CM | POA: Diagnosis not present

## 2022-02-28 DIAGNOSIS — E785 Hyperlipidemia, unspecified: Secondary | ICD-10-CM | POA: Diagnosis present

## 2022-02-28 DIAGNOSIS — F32A Depression, unspecified: Secondary | ICD-10-CM | POA: Diagnosis present

## 2022-02-28 DIAGNOSIS — M47812 Spondylosis without myelopathy or radiculopathy, cervical region: Secondary | ICD-10-CM | POA: Diagnosis not present

## 2022-02-28 DIAGNOSIS — I480 Paroxysmal atrial fibrillation: Secondary | ICD-10-CM | POA: Diagnosis not present

## 2022-02-28 DIAGNOSIS — Z95810 Presence of automatic (implantable) cardiac defibrillator: Secondary | ICD-10-CM | POA: Diagnosis not present

## 2022-02-28 DIAGNOSIS — R739 Hyperglycemia, unspecified: Secondary | ICD-10-CM | POA: Diagnosis not present

## 2022-02-28 DIAGNOSIS — Z9889 Other specified postprocedural states: Secondary | ICD-10-CM | POA: Insufficient documentation

## 2022-02-28 DIAGNOSIS — M961 Postlaminectomy syndrome, not elsewhere classified: Secondary | ICD-10-CM | POA: Diagnosis not present

## 2022-02-28 DIAGNOSIS — Z79891 Long term (current) use of opiate analgesic: Secondary | ICD-10-CM | POA: Diagnosis not present

## 2022-02-28 DIAGNOSIS — G894 Chronic pain syndrome: Secondary | ICD-10-CM | POA: Diagnosis not present

## 2022-02-28 LAB — BASIC METABOLIC PANEL
Anion gap: 11 (ref 5–15)
BUN: 20 mg/dL (ref 8–23)
CO2: 23 mmol/L (ref 22–32)
Calcium: 9.4 mg/dL (ref 8.9–10.3)
Chloride: 108 mmol/L (ref 98–111)
Creatinine, Ser: 1.03 mg/dL — ABNORMAL HIGH (ref 0.44–1.00)
GFR, Estimated: 59 mL/min — ABNORMAL LOW (ref >60)
Glucose, Bld: 231 mg/dL — ABNORMAL HIGH (ref 70–99)
Potassium: 3.7 mmol/L (ref 3.5–5.1)
Sodium: 142 mmol/L (ref 135–145)

## 2022-02-28 LAB — CBC
HCT: 47.4 % — ABNORMAL HIGH (ref 36.0–46.0)
Hemoglobin: 15.3 g/dL — ABNORMAL HIGH (ref 12.0–15.0)
MCH: 29.4 pg (ref 26.0–34.0)
MCHC: 32.3 g/dL (ref 30.0–36.0)
MCV: 91 fL (ref 80.0–100.0)
Platelets: 256 10*3/uL (ref 150–400)
RBC: 5.21 MIL/uL — ABNORMAL HIGH (ref 3.87–5.11)
RDW: 13.2 % (ref 11.5–15.5)
WBC: 11.5 10*3/uL — ABNORMAL HIGH (ref 4.0–10.5)
nRBC: 0 % (ref 0.0–0.2)

## 2022-02-28 LAB — TSH: TSH: 0.693 u[IU]/mL (ref 0.350–4.500)

## 2022-02-28 LAB — MAGNESIUM: Magnesium: 2.3 mg/dL (ref 1.7–2.4)

## 2022-02-28 LAB — MRSA NEXT GEN BY PCR, NASAL: MRSA by PCR Next Gen: NOT DETECTED

## 2022-02-28 MED ORDER — AMIODARONE LOAD VIA INFUSION
150.0000 mg | Freq: Once | INTRAVENOUS | Status: DC
  Filled 2022-02-28: qty 83.34

## 2022-02-28 MED ORDER — POTASSIUM CHLORIDE CRYS ER 20 MEQ PO TBCR
40.0000 meq | EXTENDED_RELEASE_TABLET | Freq: Once | ORAL | Status: AC
  Administered 2022-02-28: 40 meq via ORAL
  Filled 2022-02-28: qty 2

## 2022-02-28 MED ORDER — APIXABAN 5 MG PO TABS
5.0000 mg | ORAL_TABLET | Freq: Two times a day (BID) | ORAL | Status: DC
Start: 2022-03-01 — End: 2022-03-01
  Administered 2022-03-01: 5 mg via ORAL
  Filled 2022-02-28: qty 1

## 2022-02-28 MED ORDER — TRAZODONE HCL 50 MG PO TABS
150.0000 mg | ORAL_TABLET | Freq: Every day | ORAL | Status: DC
Start: 2022-02-28 — End: 2022-03-01
  Administered 2022-02-28: 150 mg via ORAL
  Filled 2022-02-28: qty 3

## 2022-02-28 MED ORDER — TAPENTADOL HCL 50 MG PO TABS
50.0000 mg | ORAL_TABLET | Freq: Two times a day (BID) | ORAL | Status: DC
  Administered 2022-02-28 – 2022-03-01 (×2): 50 mg via ORAL
  Filled 2022-02-28 (×2): qty 1

## 2022-02-28 MED ORDER — NALOXEGOL OXALATE 12.5 MG PO TABS
12.5000 mg | ORAL_TABLET | Freq: Every day | ORAL | Status: DC
  Administered 2022-03-01: 12.5 mg via ORAL
  Filled 2022-02-28: qty 1

## 2022-02-28 MED ORDER — ACETAMINOPHEN 650 MG RE SUPP: 650.0000 mg | Freq: Four times a day (QID) | RECTAL | Status: DC | PRN

## 2022-02-28 MED ORDER — ONDANSETRON HCL 4 MG PO TABS: 4.0000 mg | ORAL_TABLET | Freq: Four times a day (QID) | ORAL | Status: DC | PRN

## 2022-02-28 MED ORDER — LEVOCETIRIZINE DIHYDROCHLORIDE 5 MG PO TABS: 5.0000 mg | ORAL_TABLET | Freq: Every day | ORAL | Status: DC

## 2022-02-28 MED ORDER — DILTIAZEM HCL 25 MG/5ML IV SOLN
20.0000 mg | Freq: Once | INTRAVENOUS | Status: AC
  Administered 2022-02-28: 20 mg via INTRAVENOUS
  Filled 2022-02-28: qty 5

## 2022-02-28 MED ORDER — METOPROLOL TARTRATE 25 MG PO TABS
25.0000 mg | ORAL_TABLET | Freq: Two times a day (BID) | ORAL | Status: DC
Start: 2022-02-28 — End: 2022-03-01
  Administered 2022-02-28 – 2022-03-01 (×3): 25 mg via ORAL
  Filled 2022-02-28 (×3): qty 1

## 2022-02-28 MED ORDER — ONDANSETRON HCL 4 MG/2ML IJ SOLN: 4.0000 mg | Freq: Four times a day (QID) | INTRAMUSCULAR | Status: DC | PRN

## 2022-02-28 MED ORDER — APIXABAN 5 MG PO TABS
5.0000 mg | ORAL_TABLET | Freq: Once | ORAL | Status: AC
  Administered 2022-02-28: 5 mg via ORAL
  Filled 2022-02-28: qty 1

## 2022-02-28 MED ORDER — HYDROMORPHONE HCL 2 MG PO TABS
4.0000 mg | ORAL_TABLET | Freq: Four times a day (QID) | ORAL | Status: DC | PRN
  Administered 2022-02-28 – 2022-03-01 (×2): 4 mg via ORAL
  Filled 2022-02-28 (×2): qty 2

## 2022-02-28 MED ORDER — OXYCODONE ER 36 MG PO C12A
36.0000 mg | EXTENDED_RELEASE_CAPSULE | Freq: Three times a day (TID) | ORAL | Status: DC
  Administered 2022-03-01 (×2): 36 mg via ORAL
  Filled 2022-02-28 (×2): qty 1

## 2022-02-28 MED ORDER — AMIODARONE HCL IN DEXTROSE 360-4.14 MG/200ML-% IV SOLN: 60.0000 mg/h | INTRAVENOUS | Status: DC

## 2022-02-28 MED ORDER — FLUTICASONE PROPIONATE 50 MCG/ACT NA SUSP
2.0000 | Freq: Every day | NASAL | Status: DC
  Administered 2022-02-28: 2 via NASAL
  Filled 2022-02-28: qty 16

## 2022-02-28 MED ORDER — ACETAMINOPHEN 325 MG PO TABS
650.0000 mg | ORAL_TABLET | Freq: Four times a day (QID) | ORAL | Status: DC | PRN
  Administered 2022-02-28: 650 mg via ORAL
  Filled 2022-02-28: qty 2

## 2022-02-28 MED ORDER — AMIODARONE HCL IN DEXTROSE 360-4.14 MG/200ML-% IV SOLN: 30.0000 mg/h | INTRAVENOUS | Status: DC

## 2022-02-28 MED ORDER — BUSPIRONE HCL 15 MG PO TABS
15.0000 mg | ORAL_TABLET | Freq: Every day | ORAL | Status: DC
Start: 2022-02-28 — End: 2022-03-01
  Administered 2022-02-28: 15 mg via ORAL
  Filled 2022-02-28: qty 1

## 2022-02-28 MED ORDER — TAMSULOSIN HCL 0.4 MG PO CAPS
0.4000 mg | ORAL_CAPSULE | Freq: Every day | ORAL | Status: DC
Start: 2022-02-28 — End: 2022-03-01
  Administered 2022-02-28: 0.4 mg via ORAL
  Filled 2022-02-28: qty 1

## 2022-02-28 MED ORDER — OXYCODONE HCL ER 10 MG PO T12A: 40.0000 mg | EXTENDED_RELEASE_TABLET | Freq: Two times a day (BID) | ORAL | Status: DC

## 2022-02-28 MED ORDER — DILTIAZEM HCL 60 MG PO TABS: 30.0000 mg | ORAL_TABLET | Freq: Once | ORAL | Status: DC

## 2022-02-28 MED ORDER — DILTIAZEM HCL-DEXTROSE 125-5 MG/125ML-% IV SOLN (PREMIX)
5.0000 mg/h | INTRAVENOUS | Status: DC
  Administered 2022-02-28: 10 mg/h via INTRAVENOUS
  Administered 2022-02-28: 5 mg/h via INTRAVENOUS
  Filled 2022-02-28 (×2): qty 125

## 2022-02-28 MED ORDER — APIXABAN 5 MG PO TABS
5.0000 mg | ORAL_TABLET | Freq: Two times a day (BID) | ORAL | Status: DC
  Administered 2022-02-28: 5 mg via ORAL
  Filled 2022-02-28: qty 1

## 2022-02-28 MED ORDER — SODIUM CHLORIDE 0.9 % IV BOLUS
500.0000 mL | Freq: Once | INTRAVENOUS | Status: AC
Start: 2022-02-28 — End: 2022-02-28
  Administered 2022-02-28: 500 mL via INTRAVENOUS

## 2022-02-28 MED ORDER — BACLOFEN 20 MG PO TABS
20.0000 mg | ORAL_TABLET | Freq: Three times a day (TID) | ORAL | Status: DC
Start: 2022-02-28 — End: 2022-03-01
  Administered 2022-02-28 – 2022-03-01 (×3): 20 mg via ORAL
  Filled 2022-02-28 (×4): qty 1

## 2022-02-28 MED ORDER — LORATADINE 10 MG PO TABS
10.0000 mg | ORAL_TABLET | Freq: Every day | ORAL | Status: DC
  Administered 2022-03-01: 10 mg via ORAL
  Filled 2022-02-28: qty 1

## 2022-02-28 MED ORDER — DILTIAZEM LOAD VIA INFUSION: 20.0000 mg | Freq: Once | INTRAVENOUS | Status: DC

## 2022-02-28 NOTE — ED Notes (Signed)
RN notified Dr. Robb Matar that pt had convert to NSR in the 70s. EKG performed. Pt denies chest pain or SOB at this time. Plan of care reviewed with pt. Pt awaiting transport to [facility].

## 2022-02-28 NOTE — ED Notes (Signed)
[facility] has been dispatched.  

## 2022-02-28 NOTE — ED Provider Notes (Signed)
[facility]-EMERGENCY DEPT Provider Note   CSN: [id number] Arrival date & time: 02/28/22  1018     History  Chief Complaint  Patient presents with   Chest Pain    Melody Huerta is a 71 y.o. female.  HPI    71 year old female comes in with chief complaint of abnormal heart rate. Patient has history of hyperlipidemia, chronic pain syndrome.  She indicates that she woke up today and was feeling jittery.  She went to her pain clinic and was found to have heart rate in the 240s and advised to come to the ER.  Review of systems positive for dizziness and feeling lightheaded.  Patient has some burning type chest pain.  She denies any shortness of breath.  No history of similar symptoms in the past.  Patient has no cardiac disease history. Pt has no hx of PE, DVT and denies any exogenous hormone (testosterone / estrogen) use, long distance travels or surgery in the past 6 weeks, active cancer, recent immobilization.  Patient denies any new medications, substance use disorder or recent illnesses/infection-like symptoms.   Home Medications Prior to Admission medications   Medication Sig Start Date End Date Taking? Authorizing Provider  Alpha-Lipoic Acid 600 MG CAPS Take 600 mg by mouth daily.   Yes [provider]  baclofen (LIORESAL) 20 MG tablet Take 20 mg by mouth every 8 (eight) hours. 02/04/22  Yes [provider]  busPIRone (BUSPAR) 15 MG tablet Take 15 mg by mouth at bedtime. 02/02/22  Yes [provider]  fluticasone (FLONASE) 50 MCG/ACT nasal spray Place 2 sprays into both nostrils at bedtime. 02/19/22  Yes [provider]  HYDROmorphone (DILAUDID) 4 MG tablet Take 4 mg by mouth in the morning, at noon, in the evening, and at bedtime. 02/04/22  Yes [provider]  levocetirizine (XYZAL) 5 MG tablet Take 5 mg by mouth daily in the afternoon.   Yes [provider]  MOVANTIK 12.5 MG TABS tablet Take 12.5 mg by mouth  daily. 02/01/22  Yes [provider]  Polyethyl Glycol-Propyl Glycol (SYSTANE OP) Apply 2 drops to eye in the morning, at noon, and at bedtime.   Yes [provider]  tamsulosin (FLOMAX) 0.4 MG CAPS capsule Take 0.4 mg by mouth daily. 02/15/22  Yes [provider]  tapentadol (NUCYNTA) 50 MG tablet Take 50 mg by mouth every 12 (twelve) hours.   Yes [provider]  traZODone (DESYREL) 150 MG tablet Take 150 mg by mouth at bedtime.   Yes [provider]  XTAMPZA ER 36 MG C12A Take 1 capsule by mouth every 8 (eight) hours. 02/04/22  Yes [provider]  baclofen (LIORESAL) 10 MG tablet Take 10 mg by mouth 3 (three) times daily. Patient not taking: Reported on 02/28/2022    [provider]  estradiol (CLIMARA - DOSED IN MG/24 HR) 0.0375 mg/24hr patch Place 1 patch onto the skin once a week. Sunday Patient not taking: Reported on 02/28/2022 08/02/15   [provider]  gabapentin (NEURONTIN) 300 MG capsule Take 300 mg by mouth every 8 (eight) hours. Patient not taking: Reported on 02/28/2022 08/05/15   [provider]  NUCYNTA 100 MG TABS Take 100 mg by mouth every 4 (four) hours as needed (pain).  Patient not taking: Reported on 02/28/2022 08/05/15   [provider]  oxymorphone (OPANA ER) 40 MG 12 hr tablet Take 40 mg by mouth every 12 (twelve) hours.   Patient not taking:  Reported on 02/28/2022    [provider]  traZODone (DESYREL) 50 MG tablet Take 100 mg by mouth at bedtime.  Patient not taking: Reported on 02/28/2022 04/16/14   [provider]      Allergies    Botox [botulinum toxin type a], Bactrim [sulfamethoxazole-trimethoprim], and Penicillins    Review of Systems   Review of Systems  All other systems reviewed and are negative.   Physical Exam Updated Vital Signs BP (!) 106/54   Pulse 80   Temp 99.4 F (37.4 C) (Oral)   Resp 18   Ht 5' 8.5" (1.74 m)   Wt 81.6 kg   SpO2 94%    BMI 26.97 kg/m  Physical Exam Vitals and nursing note reviewed.  Constitutional:      Appearance: She is well-developed.  HENT:     Head: Atraumatic.  Cardiovascular:     Rate and Rhythm: Tachycardia present.     Heart sounds: Normal heart sounds.  Pulmonary:     Effort: Pulmonary effort is normal.  Musculoskeletal:     Cervical back: Normal range of motion and neck supple.  Skin:    General: Skin is warm and dry.  Neurological:     Mental Status: She is alert and oriented to person, place, and time.     ED Results / Procedures / Treatments   Labs (all labs ordered are listed, but only abnormal results are displayed) Labs Reviewed  BASIC METABOLIC PANEL - Abnormal; Notable for the following components:      Result Value   Glucose, Bld 231 (*)    Creatinine, Ser 1.03 (*)    GFR, Estimated 59 (*)    All other components within normal limits  CBC - Abnormal; Notable for the following components:   WBC 11.5 (*)    RBC 5.21 (*)    Hemoglobin 15.3 (*)    HCT 47.4 (*)    All other components within normal limits  MAGNESIUM  TSH    EKG EKG Interpretation  Date/Time:  Wednesday February 28 2022 15:43:40 EDT Ventricular Rate:  78 PR Interval:  211 QRS Duration: 100 QT Interval:  390 QTC Calculation: 445 R Axis:   23 Text Interpretation: Sinus rhythm converted to sinus rhythm Confirmed by Derwood Kaplan [phone]) on 02/28/2022 3:58:52 PM    Date: 02/28/2022  Rate: 247  Rhythm: Narrow complex, tachycardia  QRS Axis: normal  Intervals: normal  ST/T Wave abnormalities: normal  Conduction Disutrbances: none  Narrative Interpretation: unremarkable  ED ECG REPORT   Date: 02/28/2022  Rate: 115  Rhythm: atrial fibrillation  QRS Axis: normal  Intervals: normal  ST/T Wave abnormalities: nonspecific ST/T changes  Conduction Disutrbances:none  Narrative Interpretation:   Old EKG Reviewed: A-fib is new  I have personally reviewed the EKG tracing and agree with the  computerized printout as noted.    Radiology DG Chest Port 1 View  Result Date: 02/28/2022 CLINICAL DATA:  Insert is EXAM: PORTABLE CHEST 1 VIEW COMPARISON:  Radiograph 09/01/2008 FINDINGS: Cardiomediastinal silhouette is within normal limits. There is no focal airspace consolidation. There is no pleural effusion. No pneumothorax. Defibrillator pads overlie the chest. No acute osseous abnormality. Thoracic spondylosis. IMPRESSION: No evidence of acute cardiopulmonary disease. Electronically Signed   By: Caprice Renshaw M.D.   On: 02/28/2022 11:00    Procedures .Critical Care  Performed by: Derwood Kaplan, MD Authorized by: Derwood Kaplan, MD   Critical care provider statement:    Critical care time (minutes):  78  Critical care was necessary to treat or prevent imminent or life-threatening deterioration of the following conditions:  Cardiac failure and circulatory failure   Critical care was time spent personally by me on the following activities:  Development of treatment plan with patient or surrogate, discussions with consultants, evaluation of patient's response to treatment, examination of patient, ordering and review of laboratory studies, ordering and review of radiographic studies, ordering and performing treatments and interventions, pulse oximetry, re-evaluation of patient's condition and review of old charts     Medications Ordered in ED Medications  diltiazem (CARDIZEM) tablet 30 mg (0 mg Oral Hold 02/28/22 1127)  apixaban (ELIQUIS) tablet 5 mg (5 mg Oral Given 02/28/22 1141)  diltiazem (CARDIZEM) 125 mg in dextrose 5% 125 mL (1 mg/mL) infusion (15 mg/hr Intravenous Rate/Dose Change 02/28/22 1356)  metoprolol tartrate (LOPRESSOR) tablet 25 mg (25 mg Oral Given 02/28/22 1425)  baclofen (LIORESAL) tablet 20 mg (20 mg Oral Not Given 02/28/22 1548)  busPIRone (BUSPAR) tablet 15 mg (has no administration in time range)  fluticasone (FLONASE) 50 MCG/ACT nasal spray 2 spray (has no  administration in time range)  HYDROmorphone (DILAUDID) tablet 4 mg (has no administration in time range)  naloxegol oxalate (MOVANTIK) tablet 12.5 mg (has no administration in time range)  traZODone (DESYREL) tablet 150 mg (has no administration in time range)  tamsulosin (FLOMAX) capsule 0.4 mg (has no administration in time range)  tapentadol (NUCYNTA) tablet 50 mg (has no administration in time range)  acetaminophen (TYLENOL) tablet 650 mg (has no administration in time range)    Or  acetaminophen (TYLENOL) suppository 650 mg (has no administration in time range)  ondansetron (ZOFRAN) tablet 4 mg (has no administration in time range)    Or  ondansetron (ZOFRAN) injection 4 mg (has no administration in time range)  loratadine (CLARITIN) tablet 10 mg (has no administration in time range)  sodium chloride 0.9 % bolus 500 mL (0 mLs Intravenous Stopped 02/28/22 1141)  diltiazem (CARDIZEM) injection 20 mg (20 mg Intravenous Given 02/28/22 1046)  potassium chloride SA (KLOR-CON M) CR tablet 40 mEq (40 mEq Oral Given 02/28/22 1427)    ED Course/ Medical Decision Making/ A&P                           Medical Decision Making Amount and/or Complexity of Data Reviewed Labs: ordered. Radiology: ordered.  Risk Prescription drug management. Decision regarding hospitalization.   This patient presents to the ED with chief complaint(s) of abnormal heart rate with pertinent past medical history of hyperlipidemia, chronic pain syndrome and no cardiac disease history which further complicates the presenting complaint. The complaint involves an extensive differential diagnosis and also carries with it a high risk of complications and morbidity.    Patient arrives to the ER with symptoms of dizziness, feeling jittery and heart rate in the 250s.  The differential diagnosis includes PSVT, A-flutter, AVNRT. Patient's cardiac history and her family's cardiac history is overall reassuring.  History is not  indicative of underlying infection, patient has a pulse and is stable, patient has narrow complex tachycardia.  I do not think there is any V. tach.  The initial plan is to get basic labs.  Patient placed on cardiac monitor and also cardiac pads applied.  Cardioversion consent obtained in case she gets unstable.   Additional history obtained: Additional history obtained from spouse  Independent labs interpretation:  The following labs were independently interpreted: Patient's basic metabolic profile is  normal, potassium and magnesium are normal.  No renal failure.  No leukocytosis or severe anemia.  Independent visualization of imaging: - I independently visualized the following imaging with scope of interpretation limited to determining acute life threatening conditions related to emergency care: X-ray of the chest, which revealed no evidence of acute pulmonary edema  Treatment and Reassessment: Patient reassessed.  She was given Dilt bolus, heart rate improved to 115.  Patient noted to be in A-fib that is new for her. Thereafter her heart rate increased again.  We started patient on diltiazem drip.  Cardiology team consulted.  They saw the patient and recommended admission. They prefer cardioversion post TEE.  Patient was given oral metoprolol and her heart rate on reassessment at 4 PM is better.  Patient now is in sinus rhythm.  Stable for admission.  Patient's [place] Vasc score is 2 for age and gender  Consultation: - Consulted or discussed management/test interpretation w/ external professional: Cardiology service    Final Clinical Impression(s) / ED Diagnoses Final diagnoses:  Atrial fibrillation with RVR ([place])    Rx / DC Orders ED Discharge Orders          Ordered    Amb referral to AFIB Clinic        02/28/22 [place], [place], MD 02/28/22 1607

## 2022-02-28 NOTE — Discharge Instructions (Signed)

## 2022-02-28 NOTE — ED Notes (Signed)
X-ray at bedside

## 2022-02-28 NOTE — H&P (Signed)
History and Physical    Patient: Melody Huerta [id number] DOB: September 08, 1950 DOA: 02/28/2022 DOS: the patient was seen and examined on 02/28/2022 PCP: Orpah Melter, MD  Patient coming from: Home  Chief Complaint:  Chief Complaint  Patient presents with   Chest Pain   HPI: TEVA SPEAKE is a 71 y.o. female with medical history significant of osteoarthritis, chronic pain, depression, gallstone, hyperlipidemia, migraine headaches, sleep apnea not [place] to use CPAP, history of UTIs who is coming to the emergency department referred from her pain management clinic after having chest pressure, palpitations, dyspnea while waiting for her appointment.  She was found to have atrial fibrillation with RVR on arrival to the emergency department.  No PND, orthopnea or pitting edema lower extremities.  She has been otherwise asymptomatic at home.  She drinks 2 cups of coffee daily.  No fever, chills or night sweats. No sore throat, rhinorrhea, dyspnea, wheezing or hemoptysis.  No appetite changes, abdominal pain, diarrhea, constipation, melena or hematochezia.  No flank pain, dysuria, frequency or hematuria.  No polyuria, polydipsia, polyphagia or blurred vision.  ED course: Initial vital signs were temperature 99 F, pulse 248, respiration 23, BP 139/105 mmHg O2 sat 95% on room air.  The patient was started on a diltiazem infusion after being given 20 mg IVP bolus.  She also received 500 mL of normal saline.  I added K-Lor 40 mEq p.o. x1.  I was going to order another bolus of fluid, but then the patient's heart rate decreased and rhythm went to sinus.  Lab work: Her CBC is her white count 11.5, hemoglobin 15.3 g/dL platelets 256.  TSH was 0.693 IU/mL.  Magnesium was 2.3 mg/dL.  BMP showed normal electrolytes and BUN.  Glucose 231 and creatinine 1.03 mg/dL.  Imaging: Portable 1 view chest radiograph with no evidence of acute cardiopulmonary disease.  Review of Systems: As mentioned in the history of  present illness. All other systems reviewed and are negative.  Past Medical History:  Diagnosis Date   Arthritis    Chronic pain disorder    Complication of anesthesia    WITH LAST CERVICAL SURGERY PT VERY SICK N+V   DDD (degenerative disc disease), cervical    CERV+ LUMBAR   Depression    Gallstones    HLD (hyperlipidemia)    pt denies   Migraines    PONV (postoperative nausea and vomiting)    Sleep apnea    on CPAP  NOT [place] TO USE  (USING [employer]ES MOUTH PIECE)   UTI (lower urinary tract infection)    HX  UTI     Past Surgical History:  Procedure Laterality Date   2nd lumbar fusion  2001   [place]     2013   FUSION   CHOLECYSTECTOMY     L4,L5,S1,S2 fusion  1998   left shoulder arthroscopy Left 09/08/08   Donnald Garre MD, frozen shoulder   NASAL SINUS SURGERY     1980S   PERIFORMIS     2001 OR 02   POSTERIOR CERVICAL FUSION/FORAMINOTOMY N/A 10/03/2015   Procedure: POSTERIOR CERVICAL FUSION/FORAMINOTOMY CERVICAL SIX-SEVEN WITH LATERAL MASS FIXATION;  Surgeon: Newman Pies, MD;  Location: [place] [facility];  Service: Neurosurgery;  Laterality: N/A;   ROTATOR CUFF REPAIR Right 2002   S1 fusion  2000 and 2001   SACROILIAC JOINT FUSION     TONSILLECTOMY     Social History:  reports that she has never smoked.  She has never used smokeless tobacco. She reports that she does not drink alcohol and does not use drugs.  Allergies  Allergen Reactions   Botox [Botulinum Toxin Type A] Swelling   Bactrim [Sulfamethoxazole-Trimethoprim] Rash    Double strength    Penicillins Rash    Has patient had a PCN reaction causing immediate rash, facial/tongue/throat swelling, SOB or lightheadedness with hypotension: Yes Has patient had a PCN reaction causing severe rash involving mucus membranes or skin necrosis: No Has patient had a PCN reaction that required hospitalization No Has patient had a PCN reaction occurring within the last 10 years: No If  all of the above answers are "NO", then may proceed with Cephalosporin use.     Family History  Problem Relation Age of Onset   Stroke Father    Heart disease Father    COPD Father        smoker   Alzheimer's disease Mother    Migraines Mother    Diabetes Paternal Grandfather    Diabetes Paternal Grandmother    Diabetes Maternal Grandfather    Colon cancer Neg Hx     Prior to Admission medications   Medication Sig Start Date End Date Taking? Authorizing Provider  Alpha-Lipoic Acid 600 MG CAPS Take 600 mg by mouth daily.   Yes [provider]  baclofen (LIORESAL) 20 MG tablet Take 20 mg by mouth every 8 (eight) hours. 02/04/22  Yes [provider]  busPIRone (BUSPAR) 15 MG tablet Take 15 mg by mouth at bedtime. 02/02/22  Yes [provider]  fluticasone (FLONASE) 50 MCG/ACT nasal spray Place 2 sprays into both nostrils at bedtime. 02/19/22  Yes [provider]  HYDROmorphone (DILAUDID) 4 MG tablet Take 4 mg by mouth in the morning, at noon, in the evening, and at bedtime. 02/04/22  Yes [provider]  levocetirizine (XYZAL) 5 MG tablet Take 5 mg by mouth daily in the afternoon.   Yes [provider]  MOVANTIK 12.5 MG TABS tablet Take 12.5 mg by mouth daily. 02/01/22  Yes [provider]  Polyethyl Glycol-Propyl Glycol (SYSTANE OP) Apply 2 drops to eye in the morning, at noon, and at bedtime.   Yes [provider]  tamsulosin (FLOMAX) 0.4 MG CAPS capsule Take 0.4 mg by mouth daily. 02/15/22  Yes [provider]  tapentadol (NUCYNTA) 50 MG tablet Take 50 mg by mouth every 12 (twelve) hours.   Yes [provider]  traZODone (DESYREL) 150 MG tablet Take 150 mg by mouth at bedtime.   Yes [provider]  XTAMPZA ER 36 MG C12A Take 1 capsule by mouth every 8 (eight) hours. 02/04/22  Yes [provider]  baclofen (LIORESAL) 10 MG tablet Take 10 mg by mouth 3 (three) times daily. Patient not  taking: Reported on 02/28/2022    [provider]  estradiol (CLIMARA - DOSED IN MG/24 HR) 0.0375 mg/24hr patch Place 1 patch onto the skin once a week. Sunday Patient not taking: Reported on 02/28/2022 08/02/15   [provider]  gabapentin (NEURONTIN) 300 MG capsule Take 300 mg by mouth every 8 (eight) hours. Patient not taking: Reported on 02/28/2022 08/05/15   [provider]  NUCYNTA 100 MG TABS Take 100 mg by mouth every 4 (four) hours as needed (pain).  Patient not taking: Reported on 02/28/2022 08/05/15   [provider]  oxymorphone (OPANA ER) 40 MG 12 hr tablet Take 40 mg by mouth every 12 (twelve) hours.   Patient  not taking: Reported on 02/28/2022    [provider]  traZODone (DESYREL) 50 MG tablet Take 100 mg by mouth at bedtime.  Patient not taking: Reported on 02/28/2022 04/16/14   [provider]    Physical Exam: Vitals:   02/28/22 1500 02/28/22 1515 02/28/22 1537 02/28/22 1538  BP: 120/81 119/88 106/62   Pulse: (!) 109 (!) 118 (!) 59   Resp: 18 (!) 24 14   Temp:    99.4 F (37.4 C)  TempSrc:    Oral  SpO2: 98% 92% 95%   Weight:      Height:       Physical Exam Vitals and nursing note reviewed.  Constitutional:      General: She is not in acute distress.    Appearance: She is well-developed.  HENT:     Head: Normocephalic.     Mouth/Throat:     Mouth: Mucous membranes are moist.  Eyes:     General: No scleral icterus.    Pupils: Pupils are equal, round, and reactive to light.  Neck:     Vascular: No JVD.  Cardiovascular:     Rate and Rhythm: Tachycardia present. Rhythm irregular.  Pulmonary:     Effort: Pulmonary effort is normal.     Breath sounds: Normal breath sounds. No wheezing, rhonchi or rales.  Abdominal:     General: There is no abdominal bruit.     Palpations: Abdomen is soft.  Musculoskeletal:     Cervical back: Neck supple.     Right lower leg: No edema.     Left lower leg: No edema.   Skin:    General: Skin is warm and dry.  Neurological:     General: No focal deficit present.     Mental Status: She is alert and oriented to person, place, and time.  Psychiatric:        Mood and Affect: Mood normal.        Behavior: Behavior normal.    Data Reviewed:  There are no new results to review at this time.  Assessment and Plan: Principal Problem:   Atrial fibrillation with RVR (HCC) Observation/PCU. Continue DOAC. Continue diltiazem infusion. Continue beta-blocker twice daily. Keep electrolytes optimized. Check echocardiogram. Cardiology input appreciated.  Active Problems:   Cervical pseudoarthrosis (HCC) Continue Nucynta 50 mg p.o. twice daily. Continue Xtampza 36 mg every 8 hours. (The patient will use her own supply).    Depression Continue trazodone 100 mg p.o. at bedtime.    Hyperglycemia Recheck glucose fasting in AM.    Hyperlipidemia Currently not on medical therapy. Follow-up with PCP.   Advance Care Planning:   Code Status: Full Code   Consults: Cardiology Carolanne Grumbling, MD).  Family Communication:   Severity of Illness: The appropriate patient status for this patient is OBSERVATION. Observation status is judged to be reasonable and necessary in order to provide the required intensity of service to ensure the patient's safety. The patient's presenting symptoms, physical exam findings, and initial radiographic and laboratory data in the context of their medical condition is felt to place them at decreased risk for further clinical deterioration. Furthermore, it is anticipated that the patient will be medically stable for discharge from the hospital within 2 midnights of admission.   Author: Bobette Mo, MD 02/28/2022 3:51 PM  For on call review [email or web address].   This document was prepared using Dragon voice recognition software and may contain some unintended transcription errors.

## 2022-02-28 NOTE — ED Notes (Signed)
Pt HR 250's maintained. MD at bedside, verbal order to titrate diltiazem from 10mg  hr to 15mg  hr

## 2022-02-28 NOTE — Progress Notes (Signed)
ANTICOAGULATION CONSULT NOTE - Initial Consult  Pharmacy Consult for Eliquis  Indication: atrial fibrillation  Allergies  Allergen Reactions   Botox [Botulinum Toxin Type A] Swelling   Bactrim [Sulfamethoxazole-Trimethoprim] Rash    Double strength    Penicillins Rash    Has patient had a PCN reaction causing immediate rash, facial/tongue/throat swelling, SOB or lightheadedness with hypotension: Yes Has patient had a PCN reaction causing severe rash involving mucus membranes or skin necrosis: No Has patient had a PCN reaction that required hospitalization No Has patient had a PCN reaction occurring within the last 10 years: No If all of the above answers are "NO", then may proceed with Cephalosporin use.     Patient Measurements: Height: 5' 8.5" (174 cm) Weight: 81.6 kg (180 lb) IBW/kg (Calculated) : 65.05 Heparin Dosing Weight:   Vital Signs: Temp: 99 F (37.2 C) (07/26 1039) Temp Source: Oral (07/26 1039) BP: 129/79 (07/26 1055) Pulse Rate: 132 (07/26 1056)  Labs: Recent Labs    02/28/22 1035  HGB 15.3*  HCT 47.4*  PLT 256    CrCl cannot be calculated (Patient's most recent lab result is older than the maximum 21 days allowed.).   Medical History: Past Medical History:  Diagnosis Date   Arthritis    Chronic pain disorder    Complication of anesthesia    WITH LAST CERVICAL SURGERY PT VERY SICK N+V   DDD (degenerative disc disease), cervical    CERV+ LUMBAR   Depression    Gallstones    HLD (hyperlipidemia)    pt denies   Migraines    PONV (postoperative nausea and vomiting)    Sleep apnea    on CPAP  NOT [place] TO USE  (USING [employer]ES MOUTH PIECE)   UTI (lower urinary tract infection)    HX  UTI      Medications:  Scheduled:   apixaban  5 mg Oral BID   diltiazem  30 mg Oral Once   Infusions:   Assessment: 71 yo presented with CP and SOB found to have new onset Afib to start apixaban per Md orders.   Goal of Therapy:  Monitor SCr, CBC,  signs/symptoms for bleeding  Plan:  Apixaban 5mg  BID Will educate prior to discharge   , PharmD, BCPS Secure Chat if ?s 02/28/2022 11:09 AM

## 2022-02-28 NOTE — Consult Note (Signed)
Cardiology Consultation:   Patient ID: Melody Huerta MRN: [id number]; DOB: 11-Aug-1950  Admit date: 02/28/2022 Date of Consult: 02/28/2022  PCP:  Orpah Melter, MD   [facility] Providers Cardiologist:  Fransico Him, MD   Patient Profile:   Melody Huerta is a 71 y.o. female with a hx of hyperlipidemia and migraines who is being seen 02/28/2022 for the evaluation of new onset Afib RVR at the request of D.r Kathrynn Humble.  History of Present Illness:   [name] has no prior cardiac history. She has chronic pain in her neck and back and is on chronic opioids. She woke up this morning feeling jittery and lightheaded.  She went to her previously scheduled appointment with her pain management physician who noted her heart rate was elevated.  She was sent to the ER for further evaluation.  Upon arrival, she was noted to be in A-fib with RVR.  She was started on Cardizem running at 10 mg/h and Eliquis 5 mg twice daily.  She has no history of bleeding.  During my interview, she no longer felt jittery or short of breath.  She was maintaining adequate blood pressure with heart rates in the 130-170s.  She denies smoking history.  She denies prior MI, PCI, and stroke.  Past Medical History:  Diagnosis Date   Arthritis    Chronic pain disorder    Complication of anesthesia    WITH LAST CERVICAL SURGERY PT VERY SICK N+V   DDD (degenerative disc disease), cervical    CERV+ LUMBAR   Depression    Gallstones    HLD (hyperlipidemia)    pt denies   Migraines    PONV (postoperative nausea and vomiting)    Sleep apnea    on CPAP  NOT [place] TO USE  (USING MOSES MOUTH PIECE)   UTI (lower urinary tract infection)    HX  UTI      Past Surgical History:  Procedure Laterality Date   2nd lumbar fusion  2001   [place]     2013   FUSION   CHOLECYSTECTOMY     L4,L5,S1,S2 fusion  1998   left shoulder arthroscopy Left 09/08/08   Donnald Garre MD, frozen shoulder    NASAL SINUS SURGERY     1980S   PERIFORMIS     2001 OR 02   POSTERIOR CERVICAL FUSION/FORAMINOTOMY N/A 10/03/2015   Procedure: POSTERIOR CERVICAL FUSION/FORAMINOTOMY CERVICAL SIX-SEVEN WITH LATERAL MASS FIXATION;  Surgeon: Newman Pies, MD;  Location: [place] [facility];  Service: Neurosurgery;  Laterality: N/A;   ROTATOR CUFF REPAIR Right 2002   S1 fusion  2000 and 2001   SACROILIAC JOINT FUSION     TONSILLECTOMY       Home Medications:  Prior to Admission medications   Medication Sig Start Date End Date Taking? Authorizing Provider  baclofen (LIORESAL) 10 MG tablet Take 10 mg by mouth 3 (three) times daily.    [provider]  butalbital-acetaminophen-caffeine (FIORICET, ESGIC) 50-325-40 MG tablet Take 1 tablet by mouth 2 (two) times daily as needed for headache.    [provider]  estradiol (CLIMARA - DOSED IN MG/24 HR) 0.0375 mg/24hr patch Place 1 patch onto the skin once a week. Sunday 08/02/15   [provider]  gabapentin (NEURONTIN) 300 MG capsule Take 300 mg by mouth every 8 (eight) hours. 08/05/15   [provider]  NUCYNTA 100 MG TABS Take 100 mg by mouth every 4 (four)  hours as needed (pain).  08/05/15   [provider]  oxymorphone (OPANA ER) 40 MG 12 hr tablet Take 40 mg by mouth every 12 (twelve) hours.      [provider]  traZODone (DESYREL) 50 MG tablet Take 100 mg by mouth at bedtime.  04/16/14   [provider]  zolpidem (AMBIEN CR) 12.5 MG CR tablet Take 12.5 mg by mouth at bedtime as needed for sleep.    [provider]    Inpatient Medications: Scheduled Meds:  apixaban  5 mg Oral BID   diltiazem  30 mg Oral Once   metoprolol tartrate  25 mg Oral BID   Continuous Infusions:  diltiazem (CARDIZEM) infusion 10 mg/hr (02/28/22 1221)   PRN Meds:   Allergies:    Allergies  Allergen Reactions   Botox [Botulinum Toxin Type A] Swelling   Bactrim [Sulfamethoxazole-Trimethoprim] Rash     Double strength    Penicillins Rash    Has patient had a PCN reaction causing immediate rash, facial/tongue/throat swelling, SOB or lightheadedness with hypotension: Yes Has patient had a PCN reaction causing severe rash involving mucus membranes or skin necrosis: No Has patient had a PCN reaction that required hospitalization No Has patient had a PCN reaction occurring within the last 10 years: No If all of the above answers are "NO", then may proceed with Cephalosporin use.     Social History:   Social History   Socioeconomic History   Marital status: Married    Spouse name: Not on file   Number of children: 2   Years of education: Not on file   Highest education level: Not on file  Occupational History   Not on file  Tobacco Use   Smoking status: Never   Smokeless tobacco: Never   Tobacco comments:    tobacco use - no  Substance and Sexual Activity   Alcohol use: No   Drug use: No   Sexual activity: Not on file  Other Topics Concern   Not on file  Social History Narrative   Married, disabled 1998    Social Determinants of Health   Financial Resource Strain: Not on file  Food Insecurity: Not on file  Transportation Needs: Not on file  Physical Activity: Not on file  Stress: Not on file  Social Connections: Not on file  Intimate Partner Violence: Not on file    Family History:    Family History  Problem Relation Age of Onset   Stroke Father    Heart disease Father    COPD Father        smoker   Alzheimer's disease Mother    Migraines Mother    Diabetes Paternal Grandfather    Diabetes Paternal Grandmother    Diabetes Maternal Grandfather    Colon cancer Neg Hx      ROS:  Please see the history of present illness.   All other ROS reviewed and negative.     Physical Exam/Data:   Vitals:   02/28/22 1300 02/28/22 1305 02/28/22 1320 02/28/22 1325  BP: 107/77  138/72 127/84  Pulse:  (!) 101 (!) 154 (!) 117  Resp: 15 (!) 22 19 20   Temp:       TempSrc:      SpO2: 92% 92% 95% 93%  Weight:      Height:        Intake/Output Summary (Last 24 hours) at 02/28/2022 1332 Last data filed at 02/28/2022 1141 Gross per 24 hour  Intake 500  ml  Output --  Net 500 ml      02/28/2022   10:33 AM 10/03/2015    6:46 AM 09/29/2015   11:02 AM  Last 3 Weights  Weight (lbs) 180 lb 185 lb 186 lb 1.1 oz  Weight (kg) 81.647 kg 83.915 kg 84.4 kg     Body mass index is 26.97 kg/m.  General:  Well nourished, well developed, in no acute distress HEENT: normal Neck: no JVD Vascular: No carotid bruits; Distal pulses 2+ bilaterally Cardiac:  iRRR Lungs:  clear to auscultation bilaterally, no wheezing, rhonchi or rales  Abd: soft, nontender, no hepatomegaly  Ext: no edema Musculoskeletal:  No deformities, BUE and BLE strength normal and equal Skin: warm and dry  Neuro:  CNs 2-12 intact, no focal abnormalities noted Psych:  Normal affect   EKG:  The EKG was personally reviewed and demonstrates:   Telemetry:  Telemetry was personally reviewed and demonstrates:  Afib with RVR rates up to 180s  Relevant CV Studies:  Echo pending  Laboratory Data:  High Sensitivity Troponin:  No results for input(s): "TROPONINIHS" in the last 720 hours.   Chemistry Recent Labs  Lab 02/28/22 1035  NA 142  K 3.7  CL 108  CO2 23  GLUCOSE 231*  BUN 20  CREATININE 1.03*  CALCIUM 9.4  MG 2.3  GFRNONAA 59*  ANIONGAP 11    No results for input(s): "PROT", "ALBUMIN", "AST", "ALT", "ALKPHOS", "BILITOT" in the last 168 hours. Lipids No results for input(s): "CHOL", "TRIG", "HDL", "LABVLDL", "LDLCALC", "CHOLHDL" in the last 168 hours.  Hematology Recent Labs  Lab 02/28/22 1035  WBC 11.5*  RBC 5.21*  HGB 15.3*  HCT 47.4*  MCV 91.0  MCH 29.4  MCHC 32.3  RDW 13.2  PLT 256   Thyroid  Recent Labs  Lab 02/28/22 1035  TSH 0.693    BNPNo results for input(s): "BNP", "PROBNP" in the last 168 hours.  DDimer No results for input(s): "DDIMER" in the  last 168 hours.   Radiology/Studies:  DG Chest Port 1 View  Result Date: 02/28/2022 CLINICAL DATA:  Insert is EXAM: PORTABLE CHEST 1 VIEW COMPARISON:  Radiograph 09/01/2008 FINDINGS: Cardiomediastinal silhouette is within normal limits. There is no focal airspace consolidation. There is no pleural effusion. No pneumothorax. Defibrillator pads overlie the chest. No acute osseous abnormality. Thoracic spondylosis. IMPRESSION: No evidence of acute cardiopulmonary disease. Electronically Signed   By: Caprice Renshaw M.D.   On: 02/28/2022 11:00     Assessment and Plan:   Atrial fibrillation with RVR  Need for chronic anticoagulation - new onset, although timing is unclear - currently on Cardizem IV 10 mg/h - Because she has adequate blood pressure, we will add p.o. metoprolol, may titrate up as pressure allows. As long as her blood pressure is maintaining, will avoid amiodarone since she has not been adequately anticoagulated.  We will plan for TEE guided cardioversion tomorrow at 10:30 AM with Dr. Eden Emms. - TSH WNL - Mg and K WNL - will obtain echo if rates improve, otherwise will await TEE tomorrow   [facility] has been requested to perform a transesophageal echocardiogram on Melody Huerta for atrial fibrillation.  After careful review of history and examination, the risks and benefits of transesophageal echocardiogram have been explained including risks of esophageal damage, perforation (1:10,000 risk), bleeding, pharyngeal hematoma as well as other potential complications associated with conscious sedation including aspiration, arrhythmia, respiratory failure and death. Alternatives to treatment were discussed, questions were answered.  Patient is willing to proceed.   She is scheduled tomorrow with Dr. Johnsie Cancel at 10:30 AM. NPO at MN please.    Risk Assessment/Risk Scores:     CHA2DS2-VASc Score = 2   This indicates a 2.2% annual risk of stroke. The patient's score is based upon: CHF  History: 0 HTN History: 0 Diabetes History: 0 Stroke History: 0 Vascular Disease History: 0 Age Score: 1 Gender Score: 1          For questions or updates, please contact [place] Please consult [email or web address] for contact info under    Signed, Ledora Bottcher, PA  02/28/2022 1:32 PM

## 2022-02-28 NOTE — Progress Notes (Signed)
While doing nursing admission history pt states she has medications with her. I told her and her husband that she will need to send them home and the nurses will give her whatever medications the doctor orders. Spouse said to "hold on"and states "she is on pain management program and will need to take her own medications right on time or she will go into withdrawal." Told pt's er rn Elayne Snare, RN Explained to him that doctor will need to write an order for this before she will be [place] to take her own pain medications. Spouse states ok.  Darcel Smalling BSN, RN-BC Throughput Nurse 02/28/2022 2:56 PM

## 2022-03-01 ENCOUNTER — Other Ambulatory Visit (HOSPITAL_COMMUNITY): Payer: Self-pay

## 2022-03-01 ENCOUNTER — Observation Stay (HOSPITAL_BASED_OUTPATIENT_CLINIC_OR_DEPARTMENT_OTHER): Payer: Medicare Other

## 2022-03-01 ENCOUNTER — Telehealth: Payer: Self-pay | Admitting: Pharmacy Technician

## 2022-03-01 ENCOUNTER — Other Ambulatory Visit (HOSPITAL_COMMUNITY): Payer: Medicare Other

## 2022-03-01 ENCOUNTER — Encounter (HOSPITAL_COMMUNITY): Payer: Self-pay | Admitting: Internal Medicine

## 2022-03-01 ENCOUNTER — Encounter (HOSPITAL_COMMUNITY)
Admission: EM | Disposition: A | Discharge status: Home / Self Care | Payer: Self-pay | Source: Home / Self Care | Attending: Emergency Medicine

## 2022-03-01 DIAGNOSIS — E785 Hyperlipidemia, unspecified: Secondary | ICD-10-CM

## 2022-03-01 DIAGNOSIS — S129XXD Fracture of neck, unspecified, subsequent encounter: Secondary | ICD-10-CM | POA: Diagnosis not present

## 2022-03-01 DIAGNOSIS — F32A Depression, unspecified: Secondary | ICD-10-CM

## 2022-03-01 DIAGNOSIS — R739 Hyperglycemia, unspecified: Secondary | ICD-10-CM

## 2022-03-01 DIAGNOSIS — I4891 Unspecified atrial fibrillation: Secondary | ICD-10-CM | POA: Diagnosis not present

## 2022-03-01 LAB — CBC WITH DIFFERENTIAL/PLATELET
Abs Immature Granulocytes: 0.03 10*3/uL (ref 0.00–0.07)
Basophils Absolute: 0.1 10*3/uL (ref 0.0–0.1)
Basophils Relative: 1 %
Eosinophils Absolute: 0.2 10*3/uL (ref 0.0–0.5)
Eosinophils Relative: 2 %
HCT: 40.3 % (ref 36.0–46.0)
Hemoglobin: 12.9 g/dL (ref 12.0–15.0)
Immature Granulocytes: 0 %
Lymphocytes Relative: 37 %
Lymphs Abs: 3.6 10*3/uL (ref 0.7–4.0)
MCH: 29.2 pg (ref 26.0–34.0)
MCHC: 32 g/dL (ref 30.0–36.0)
MCV: 91.2 fL (ref 80.0–100.0)
Monocytes Absolute: 0.8 10*3/uL (ref 0.1–1.0)
Monocytes Relative: 8 %
Neutro Abs: 5.1 10*3/uL (ref 1.7–7.7)
Neutrophils Relative %: 52 %
Platelets: 237 10*3/uL (ref 150–400)
RBC: 4.42 MIL/uL (ref 3.87–5.11)
RDW: 13.2 % (ref 11.5–15.5)
WBC: 9.8 10*3/uL (ref 4.0–10.5)
nRBC: 0 % (ref 0.0–0.2)

## 2022-03-01 LAB — HIV ANTIBODY (ROUTINE TESTING W REFLEX): HIV Screen 4th Generation wRfx: NONREACTIVE

## 2022-03-01 LAB — BASIC METABOLIC PANEL
Anion gap: 6 (ref 5–15)
BUN: 16 mg/dL (ref 8–23)
CO2: 27 mmol/L (ref 22–32)
Calcium: 8.9 mg/dL (ref 8.9–10.3)
Chloride: 109 mmol/L (ref 98–111)
Creatinine, Ser: 0.97 mg/dL (ref 0.44–1.00)
GFR, Estimated: >60 mL/min (ref >60)
Glucose, Bld: 106 mg/dL — ABNORMAL HIGH (ref 70–99)
Potassium: 4.1 mmol/L (ref 3.5–5.1)
Sodium: 142 mmol/L (ref 135–145)

## 2022-03-01 LAB — ECHOCARDIOGRAM COMPLETE
Area-P 1/2: 4.06 cm2
Calc EF: 63.2 %
Height: 68.5 in
S' Lateral: 3.3 cm
Single Plane A2C EF: 61.8 %
Single Plane A4C EF: 64.1 %
Weight: 2880 oz

## 2022-03-01 LAB — PROTIME-INR
INR: 1.2 (ref 0.8–1.2)
Prothrombin Time: 14.7 seconds (ref 11.4–15.2)

## 2022-03-01 SURGERY — CANCELLED PROCEDURE

## 2022-03-01 MED ORDER — METOPROLOL SUCCINATE ER 50 MG PO TB24
50.0000 mg | ORAL_TABLET | Freq: Every day | ORAL | 0 refills | Status: DC
  Filled 2022-03-01: qty 30, 30d supply, fill #0

## 2022-03-01 MED ORDER — APIXABAN 5 MG PO TABS
5.0000 mg | ORAL_TABLET | Freq: Two times a day (BID) | ORAL | 0 refills | Status: DC
  Filled 2022-03-01: qty 60, 30d supply, fill #0

## 2022-03-01 MED ORDER — METOPROLOL SUCCINATE ER 50 MG PO TB24
50.0000 mg | ORAL_TABLET | Freq: Every day | ORAL | Status: DC
Start: 2022-03-01 — End: 2022-03-01

## 2022-03-01 NOTE — Telephone Encounter (Signed)
Pharmacy Patient Advocate Encounter  Insurance verification completed.    The patient is insured through [employer]    The patient is currently admitted and ran test claims for the following: Eliquis 5mg.  Copays and coinsurance results were relayed to Inpatient clinical team.  

## 2022-03-01 NOTE — Assessment & Plan Note (Signed)
Continue with trazodone and buspirone.

## 2022-03-01 NOTE — Care Management Obs Status (Signed)
MEDICARE OBSERVATION STATUS NOTIFICATION   Patient Details  Name: Melody Huerta MRN: [id number] Date of Birth: 20-Jul-1951   Medicare Observation Status Notification Given:  Yes    Beckie Busing, RN 03/01/2022, 2:31 PM

## 2022-03-01 NOTE — Progress Notes (Signed)
Progress Note  Patient Name: Melody Huerta Date of Encounter: 03/01/2022  [facility] Cardiologist: Armanda Magic, MD   Subjective   Patient was admitted with atrial fibrillation with RVR with heart rates greater than 200 bpm.  Started on IV Cardizem and metoprolol and converted to sinus rhythm last night.  Denies any chest pain or shortness of breath today.  Telemetry shows normal sinus rhythm  Inpatient Medications    Scheduled Meds:  apixaban  5 mg Oral BID   baclofen  20 mg Oral Q8H   busPIRone  15 mg Oral QHS   diltiazem  30 mg Oral Once   fluticasone  2 spray Each Nare QHS   loratadine  10 mg Oral Daily   metoprolol tartrate  25 mg Oral BID   naloxegol oxalate  12.5 mg Oral Daily   oxyCODONE ER  36 mg Oral Q8H   tamsulosin  0.4 mg Oral Daily   tapentadol  50 mg Oral Q12H   traZODone  150 mg Oral QHS   Continuous Infusions:  diltiazem (CARDIZEM) infusion Stopped (03/01/22 0300)   PRN Meds: acetaminophen **OR** acetaminophen, HYDROmorphone, ondansetron **OR** ondansetron (ZOFRAN) IV   Vital Signs    Vitals:   02/28/22 2300 03/01/22 0327 03/01/22 0700 03/01/22 0922  BP: 126/69 113/60 117/67   Pulse: 70 63 83 74  Resp: 15 17 14    Temp: 99.3 F (37.4 C) 98.9 F (37.2 C) 98.7 F (37.1 C)   TempSrc: Oral Oral Oral   SpO2: 94% 96% 96%   Weight:      Height:        Intake/Output Summary (Last 24 hours) at 03/01/2022 0929 Last data filed at 03/01/2022 0235 Gross per 24 hour  Intake 600.24 ml  Output --  Net 600.24 ml      02/28/2022   10:33 AM 10/03/2015    6:46 AM 09/29/2015   11:02 AM  Last 3 Weights  Weight (lbs) 180 lb 185 lb 186 lb 1.1 oz  Weight (kg) 81.647 kg 83.915 kg 84.4 kg      Telemetry    Normal sinus rhythm- Personally Reviewed  ECG    Normal sinus rhythm- Personally Reviewed  Physical Exam   GEN: No acute distress.   Neck: No JVD Cardiac: RRR, no murmurs, rubs, or gallops.  Respiratory: Clear to auscultation bilaterally. GI:  Soft, nontender, non-distended  MS: No edema; No deformity. Neuro:  Nonfocal  Psych: Normal affect   Labs    High Sensitivity Troponin:  No results for input(s): "TROPONINIHS" in the last 720 hours.    Chemistry Recent Labs  Lab 02/28/22 1035 03/01/22 0419  NA 142 142  K 3.7 4.1  CL 108 109  CO2 23 27  GLUCOSE 231* 106*  BUN 20 16  CREATININE 1.03* 0.97  CALCIUM 9.4 8.9  GFRNONAA 59* >60  ANIONGAP 11 6     Hematology Recent Labs  Lab 02/28/22 1035 03/01/22 0049  WBC 11.5* 9.8  RBC 5.21* 4.42  HGB 15.3* 12.9  HCT 47.4* 40.3  MCV 91.0 91.2  MCH 29.4 29.2  MCHC 32.3 32.0  RDW 13.2 13.2  PLT 256 237    BNPNo results for input(s): "BNP", "PROBNP" in the last 168 hours.   DDimer No results for input(s): "DDIMER" in the last 168 hours.   CHA2DS2-VASc Score = 2   This indicates a 2.2% annual risk of stroke. The patient's score is based upon: CHF History: 0 HTN History: 0 Diabetes History:  0 Stroke History: 0 Vascular Disease History: 0 Age Score: 1 Gender Score: 1       Radiology    DG Chest Port 1 View  Result Date: 02/28/2022 CLINICAL DATA:  Insert is EXAM: PORTABLE CHEST 1 VIEW COMPARISON:  Radiograph 09/01/2008 FINDINGS: Cardiomediastinal silhouette is within normal limits. There is no focal airspace consolidation. There is no pleural effusion. No pneumothorax. Defibrillator pads overlie the chest. No acute osseous abnormality. Thoracic spondylosis. IMPRESSION: No evidence of acute cardiopulmonary disease. Electronically Signed   By: Caprice Renshaw M.D.   On: 02/28/2022 11:00    Cardiac Studies   None  Patient Profile     71 y.o. female with a hx of hyperlipidemia and migraines who is being seen 02/28/2022 for the evaluation of new onset Afib RVR at the request of Dr. Rhunette Croft.  Assessment & Plan      Atrial fibrillation with RVR  - new onset, although timing is unclear>> seen by her pain clinic doctor and heart rates were in the 200s and sent to  the ER - In the ER patient was in atrial fibrillation with RVR as high as 200 250 bpm -Started on IV Cardizem drip as well as p.o. metoprolol and converted last night to normal sinus rhythm  - TSH WNL - Mg and K WNL - 2D echo pending - CHA2DS2-VASc score is 2 for female and age greater than 34 - Continue Eliquis 5 mg twice daily - Change Lopressor 25 mg twice daily to Toprol-XL 50 mg daily - If 2D echo is normal okay for discharge today with follow-up in A-fib clinic   [facility] will sign off.   Medication Recommendations: Apixaban 5 mg twice daily, Toprol-XL 50 mg daily Other recommendations (labs, testing, etc): None Follow up as an outpatient: A-fib clinic in 1 week  For questions or updates, please contact [facility] Please consult [email or web address] for contact info under        Signed, Armanda Magic, MD  03/01/2022, 9:29 AM

## 2022-03-01 NOTE — Assessment & Plan Note (Addendum)
Supraventricular tachycardia, atrial fibrillation (paroxysmal). Cha2ds2 VASc score is 2 (age and gender).   Patient has maintained sinus rhythm through the course of the day. Plan to continue rate control with metoprolol 50 mg succinate and anticoagulation with apixaban.  Further work up with echocardiogram showed preserved LV systolic function EF 55 to 60%, RV systolic function preserved, no significant valvular disease.    Follow up with cardiology as outpatient.

## 2022-03-01 NOTE — Progress Notes (Signed)
Echocardiogram 2D Echocardiogram has been performed.  Melody Huerta 03/01/2022, 10:40 AM

## 2022-03-01 NOTE — Progress Notes (Signed)
RN went over d/c summary w/ pt and NT removed PIV. TOC meds delivered and pt's home meds returned from pharmacy. Belongings w/ pt and her spouse. NS transporting pt to private vehicle where pt's husband will transport pt home

## 2022-03-01 NOTE — Discharge Summary (Addendum)
Physician Discharge Summary   Patient: Melody Huerta MRN: [id number] DOB: Jan 16, 1951  Admit date:     02/28/2022  Discharge date: 03/01/22  Discharge Physician: Jimmy Picket Lexey Fletes   PCP: Orpah Melter, MD   Recommendations at discharge:    Patient has converted to sinus rhythm, she will continue rate control with metoprolol and anticoagulation with apixaban Follow up as outpatient.   Discharge Diagnoses: Principal Problem:   Atrial fibrillation with RVR ([place]) Active Problems:   Hyperlipidemia   Cervical pseudoarthrosis (HCC)   Depression   Hyperglycemia  Resolved Problems:   * No resolved hospital problems. [facility] Course: Melody Huerta was admitted to the hospital with the working diagnosis of atrial fibrillation  71 yo female with the past medical history if osteoarthritis, chronic pain, depression, dyslipidemia, and migraines who presented with chest pain. Patient was referred to the ED after suffering chest pain, palpitations and dyspnea, while seating waiting for appointment at the pain clinic. On her initial physical examination her blood pressure was 139/105, HR 248, RR 23, 02 saturation 95% and temp 99. Lungs with no wheezing or rales, heart with S1 and S2 present irregularly irregular, abdomen not distended and no lower extremity edema.   Na 142, K 3,7 Cl 108 bicarbonate 23, glucose 231, bun 20 cr 1,0  Wbc 11.5 hgb 15,3 plt 256   Chest radiograph with no cardiomegaly, no infiltrates.   EKG 247 bpm, normal axis, normal qtc, supraventricular rhythm, with no significant ST segment or T wave changes.   Patient was placed on IV diltiazem with conversion to atrial fibrillation and then to sinus rhythm.  Anticoagulation with apixaban. She was transitioned to metoprolol XL and instructions to follow up as outpatient.   Assessment and Plan: * Atrial fibrillation with RVR (HCC) Supraventricular tachycardia, atrial fibrillation (paroxysmal). Cha2ds2 VASc score is 2  (age and gender).   Patient has maintained sinus rhythm through the course of the day. Plan to continue rate control with metoprolol 50 mg succinate and anticoagulation with apixaban.  Further work up with echocardiogram showed preserved LV systolic function EF 55 to [id number], RV systolic function preserved, no significant valvular disease.    Follow up with cardiology as outpatient.   Cervical pseudoarthrosis (HCC) Continue pain control as outpatient.  Patient on baclofen, and hydromorphone.   Depression Continue with trazodone and buspirone.   Hyperglycemia Transitory hyperglycemia, follow up fasting glucose 106.  TSH normal.          Consultants: cardiology  Procedures performed: none   Disposition: Home Diet recommendation:  Cardiac diet DISCHARGE MEDICATION: Allergies as of 03/01/2022       Reactions   Botox [botulinum Toxin Type A] Swelling   Bactrim [sulfamethoxazole-trimethoprim] Rash   Double strength   Penicillins Rash   Has patient had a PCN reaction causing immediate rash, facial/tongue/throat swelling, SOB or lightheadedness with hypotension: Yes Has patient had a PCN reaction causing severe rash involving mucus membranes or skin necrosis: No Has patient had a PCN reaction that required hospitalization No Has patient had a PCN reaction occurring within the last 10 years: No If all of the above answers are "NO", then may proceed with Cephalosporin use.        Medication List     STOP taking these medications    estradiol 0.0375 mg/24hr patch Commonly known as: CLIMARA - Dosed in mg/24 hr   gabapentin 300 MG capsule Commonly known as: NEURONTIN   oxymorphone 40 MG 12 hr tablet Commonly known  as: OPANA ER       TAKE these medications    Alpha-Lipoic Acid 600 MG Caps Take 600 mg by mouth daily.   apixaban 5 MG Tabs tablet Commonly known as: ELIQUIS Take 1 tablet (5 mg total) by mouth 2 (two) times daily.   baclofen 20 MG tablet Commonly  known as: LIORESAL Take 20 mg by mouth every 8 (eight) hours. What changed: Another medication with the same name was removed. Continue taking this medication, and follow the directions you see here.   busPIRone 15 MG tablet Commonly known as: BUSPAR Take 15 mg by mouth at bedtime.   fluticasone 50 MCG/ACT nasal spray Commonly known as: FLONASE Place 2 sprays into both nostrils at bedtime.   HYDROmorphone 4 MG tablet Commonly known as: DILAUDID Take 4 mg by mouth in the morning, at noon, in the evening, and at bedtime.   levocetirizine 5 MG tablet Commonly known as: XYZAL Take 5 mg by mouth daily in the afternoon.   metoprolol succinate 50 MG 24 hr tablet Commonly known as: TOPROL-XL Take 1 tablet (50 mg total) by mouth daily. Take with or immediately following a meal.   Movantik 12.5 MG Tabs tablet Generic drug: naloxegol oxalate Take 12.5 mg by mouth daily.   SYSTANE OP Apply 2 drops to eye in the morning, at noon, and at bedtime.   tamsulosin 0.4 MG Caps capsule Commonly known as: FLOMAX Take 0.4 mg by mouth daily.   tapentadol 50 MG tablet Commonly known as: NUCYNTA Take 50 mg by mouth every 12 (twelve) hours. What changed: Another medication with the same name was removed. Continue taking this medication, and follow the directions you see here.   traZODone 150 MG tablet Commonly known as: DESYREL Take 150 mg by mouth at bedtime. What changed: Another medication with the same name was removed. Continue taking this medication, and follow the directions you see here.   Xtampza ER 36 MG C12a Generic drug: oxyCODONE ER Take 1 capsule by mouth every 8 (eight) hours.        Discharge Exam: Filed Weights   02/28/22 1033  Weight: 81.6 kg   BP 124/70   Pulse 68   Temp 97.8 F (36.6 C) (Oral)   Resp 12   Ht 5' 8.5" (1.74 m)   Wt 81.6 kg   SpO2 93%   BMI 26.97 kg/m   Patient is feeling well, no palpitations, chest pain or dyspnea.   Neurology awake and  alert ENT with no pallor Cardiovascular with S1 and S2 present and rhythmic with no gallops, rubs or murmurs Respiratory with no rales or wheezing Abdomen not distended No lower extremity edema   Condition at discharge: stable  The results of significant diagnostics from this hospitalization (including imaging, microbiology, ancillary and laboratory) are listed below for reference.   Imaging Studies: ECHOCARDIOGRAM COMPLETE  Result Date: 03/01/2022    ECHOCARDIOGRAM REPORT   Patient Name:   Melody Huerta Date of Exam: 03/01/2022 Medical Rec #:  [id number]      Height:       68.5 in Accession #:    [id number]     Weight:       180.0 lb Date of Birth:  [birth year]     BSA:          1.965 m Patient Age:    70 years       BP:           117/67 mmHg Patient Gender:  F              HR:           76 bpm. Exam Location:  Inpatient Procedure: 2D Echo, Cardiac Doppler and Color Doppler STAT ECHO Indications:    I48.91* Unspeicified atrial fibrillation  History:        Patient has no prior history of Echocardiogram examinations.                 Risk Factors:Sleep Apnea and Dyslipidemia.  Sonographer:    Eulah Pont RDCS Referring Phys: [phone] TRACI R TURNER IMPRESSIONS  1. Left ventricular ejection fraction, by estimation, is 55 to 60%. The left ventricle has normal function. The left ventricle has no regional wall motion abnormalities. Left ventricular diastolic parameters were normal.  2. Right ventricular systolic function is normal. The right ventricular size is normal. Tricuspid regurgitation signal is inadequate for assessing PA pressure.  3. The mitral valve is normal in structure. Trivial mitral valve regurgitation. No evidence of mitral stenosis.  4. The aortic valve is tricuspid. Aortic valve regurgitation is not visualized. No aortic stenosis is present.  5. The inferior vena cava is normal in size with <50% respiratory variability, suggesting right atrial pressure of 8 mmHg. FINDINGS  Left Ventricle:  Left ventricular ejection fraction, by estimation, is 55 to 60%. The left ventricle has normal function. The left ventricle has no regional wall motion abnormalities. The left ventricular internal cavity size was normal in size. There is  no left ventricular hypertrophy. Left ventricular diastolic parameters were normal. Right Ventricle: The right ventricular size is normal. No increase in right ventricular wall thickness. Right ventricular systolic function is normal. Tricuspid regurgitation signal is inadequate for assessing PA pressure. Left Atrium: Left atrial size was normal in size. Right Atrium: Right atrial size was normal in size. Pericardium: There is no evidence of pericardial effusion. Mitral Valve: The mitral valve is normal in structure. Trivial mitral valve regurgitation. No evidence of mitral valve stenosis. Tricuspid Valve: The tricuspid valve is normal in structure. Tricuspid valve regurgitation is not demonstrated. Aortic Valve: The aortic valve is tricuspid. Aortic valve regurgitation is not visualized. No aortic stenosis is present. Pulmonic Valve: The pulmonic valve was normal in structure. Pulmonic valve regurgitation is not visualized. Aorta: The aortic root is normal in size and structure. Venous: The inferior vena cava is normal in size with less than 50% respiratory variability, suggesting right atrial pressure of 8 mmHg. IAS/Shunts: No atrial level shunt detected by color flow Doppler.  LEFT VENTRICLE PLAX 2D LVIDd:         5.00 cm     Diastology LVIDs:         3.30 cm     LV e' medial:    8.41 cm/s LV PW:         0.70 cm     LV E/e' medial:  11.5 LV IVS:        0.80 cm     LV e' lateral:   12.00 cm/s LVOT diam:     2.10 cm     LV E/e' lateral: 8.1 LV SV:         85 LV SV Index:   43 LVOT Area:     3.46 cm  LV Volumes (MOD) LV vol d, MOD A2C: 98.0 ml LV vol d, MOD A4C: 97.1 ml LV vol s, MOD A2C: 37.4 ml LV vol s, MOD A4C: 34.9 ml LV SV MOD A2C:  60.6 ml LV SV MOD A4C:     97.1 ml LV  SV MOD BP:      62.8 ml RIGHT VENTRICLE RV S prime:     16.80 cm/s TAPSE (M-mode): 2.6 cm LEFT ATRIUM             Index        RIGHT ATRIUM           Index LA diam:        3.60 cm 1.83 cm/m   RA Area:     13.30 cm LA Vol (A2C):   61.2 ml 31.15 ml/m  RA Volume:   33.50 ml  17.05 ml/m LA Vol (A4C):   50.3 ml 25.60 ml/m LA Biplane Vol: 55.9 ml 28.45 ml/m  AORTIC VALVE LVOT Vmax:   109.00 cm/s LVOT Vmean:  74.400 cm/s LVOT VTI:    0.245 m  AORTA Ao Root diam: 3.00 cm Ao Asc diam:  2.90 cm MITRAL VALVE MV Area (PHT): 4.06 cm    SHUNTS MV Decel Time: 187 msec    Systemic VTI:  0.24 m MV E velocity: 97.10 cm/s  Systemic Diam: 2.10 cm MV A velocity: 75.70 cm/s MV E/A ratio:  1.28 Dalton McleanMD Electronically signed by Franki Monte Signature Date/Time: 03/01/2022/10:59:41 AM    Final    DG Chest Port 1 View  Result Date: 02/28/2022 CLINICAL DATA:  Insert is EXAM: PORTABLE CHEST 1 VIEW COMPARISON:  Radiograph 09/01/2008 FINDINGS: Cardiomediastinal silhouette is within normal limits. There is no focal airspace consolidation. There is no pleural effusion. No pneumothorax. Defibrillator pads overlie the chest. No acute osseous abnormality. Thoracic spondylosis. IMPRESSION: No evidence of acute cardiopulmonary disease. Electronically Signed   By: Maurine Simmering M.D.   On: 02/28/2022 11:00    Microbiology: Results for orders placed or performed during the hospital encounter of 02/28/22  MRSA Next Gen by PCR, Nasal     Status: None   Collection Time: 02/28/22  5:00 PM   Specimen: Nasal Mucosa; Nasal Swab  Result Value Ref Range Status   MRSA by PCR Next Gen NOT DETECTED NOT DETECTED Final    Comment: (NOTE) The GeneXpert MRSA Assay (FDA approved for NASAL specimens only), is one component of a comprehensive MRSA colonization surveillance program. It is not intended to diagnose MRSA infection nor to guide or monitor treatment for MRSA infections. Test performance is not FDA approved in patients less than 7  years old. Performed at [facility], [place] [address] [place], [place] [address]     Labs: CBC: Recent Labs  Lab 02/28/22 1035 03/01/22 0049  WBC 11.5* 9.8  NEUTROABS  --  5.1  HGB 15.3* 12.9  HCT 47.4* 40.3  MCV 91.0 91.2  PLT 256 [id number]   Basic Metabolic Panel: Recent Labs  Lab 02/28/22 1035 03/01/22 0419  NA 142 142  K 3.7 4.1  CL 108 109  CO2 23 27  GLUCOSE 231* 106*  BUN 20 16  CREATININE 1.03* 0.97  CALCIUM 9.4 8.9  MG 2.3  --    Liver Function Tests: No results for input(s): "AST", "ALT", "ALKPHOS", "BILITOT", "PROT", "ALBUMIN" in the last 168 hours. CBG: No results for input(s): "GLUCAP" in the last 168 hours.  Discharge time spent: greater than 30 minutes.  Signed: Tawni Millers, MD [facility] 03/01/2022

## 2022-03-01 NOTE — Plan of Care (Signed)

## 2022-03-01 NOTE — Assessment & Plan Note (Addendum)
Continue pain control as outpatient.  Patient on baclofen, and hydromorphone.

## 2022-03-01 NOTE — Hospital Course (Signed)
Mrs. Suchan was admitted to the hospital with the working diagnosis of atrial fibrillation  71 yo female with the past medical history if osteoarthritis, chronic pain, depression, dyslipidemia, and migraines who presented with chest pain. Patient was referred to the ED after suffering chest pain, palpitations and dyspnea, while seating waiting for appointment at the pain clinic. On her initial physical examination her blood pressure was 139/105, HR 248, RR 23, 02 saturation 95% and temp 99. Lungs with no wheezing or rales, heart with S1 and S2 present irregularly irregular, abdomen not distended and no lower extremity edema.   Na 142, K 3,7 Cl 108 bicarbonate 23, glucose 231, bun 20 cr 1,0  Wbc 11.5 hgb 15,3 plt 256   Chest radiograph with no cardiomegaly, no infiltrates.   EKG 247 bpm, normal axis, normal qtc, supraventricular rhythm, with no significant ST segment or T wave changes.   Patient was placed on IV diltiazem with conversion to atrial fibrillation and then to sinus rhythm.  Anticoagulation with apixaban. She was transitioned to metoprolol XL and instructions to follow up as outpatient.

## 2022-03-01 NOTE — Assessment & Plan Note (Signed)
Transitory hyperglycemia, follow up fasting glucose 106.  TSH normal.

## 2022-03-01 NOTE — TOC Benefit Eligibility Note (Signed)
Patient Scientific laboratory technician completed.     The patient is currently admitted and upon discharge could be taking Eliquis 5mg  BID.   The current 30 day co-pay is, $28.36.   The patient is insured through [facility].

## 2022-03-08 ENCOUNTER — Ambulatory Visit (HOSPITAL_COMMUNITY)
Admit: 2022-03-08 | Discharge: 2022-03-08 | Disposition: A | Discharge status: Home / Self Care | Payer: Medicare Other | Attending: Nurse Practitioner | Admitting: Nurse Practitioner

## 2022-03-08 ENCOUNTER — Encounter (HOSPITAL_COMMUNITY): Payer: Self-pay | Admitting: Nurse Practitioner

## 2022-03-08 VITALS — BP 158/76 | HR 85 | Ht 68.5 in | Wt 185.6 lb

## 2022-03-08 DIAGNOSIS — I4892 Unspecified atrial flutter: Secondary | ICD-10-CM | POA: Insufficient documentation

## 2022-03-08 DIAGNOSIS — I471 Supraventricular tachycardia: Secondary | ICD-10-CM | POA: Diagnosis not present

## 2022-03-08 DIAGNOSIS — E785 Hyperlipidemia, unspecified: Secondary | ICD-10-CM | POA: Insufficient documentation

## 2022-03-08 DIAGNOSIS — I4891 Unspecified atrial fibrillation: Secondary | ICD-10-CM | POA: Insufficient documentation

## 2022-03-08 DIAGNOSIS — F32A Depression, unspecified: Secondary | ICD-10-CM | POA: Insufficient documentation

## 2022-03-08 DIAGNOSIS — Z7901 Long term (current) use of anticoagulants: Secondary | ICD-10-CM | POA: Insufficient documentation

## 2022-03-08 DIAGNOSIS — D6869 Other thrombophilia: Secondary | ICD-10-CM

## 2022-03-08 DIAGNOSIS — M1990 Unspecified osteoarthritis, unspecified site: Secondary | ICD-10-CM | POA: Insufficient documentation

## 2022-03-08 DIAGNOSIS — G8929 Other chronic pain: Secondary | ICD-10-CM | POA: Diagnosis not present

## 2022-03-08 MED ORDER — APIXABAN 5 MG PO TABS: 5.0000 mg | ORAL_TABLET | Freq: Two times a day (BID) | ORAL | 3 refills | Status: DC

## 2022-03-08 MED ORDER — METOPROLOL SUCCINATE ER 50 MG PO TB24: 50.0000 mg | ORAL_TABLET | Freq: Every day | ORAL | 3 refills | Status: DC

## 2022-03-08 NOTE — Progress Notes (Signed)
Primary Care Physician: Joycelyn Rua, MD Referring Physician: F/u ED visit    Lambert Keto Melody Huerta is a 71 y.o. female with a h/o if osteoarthritis, chronic pain, depression, dyslipidemia, and migraines who presented to ED with chest pain.Pt was referred  to the [facility]  ED, 7/26, after being symptomatic at the Pain clinic. Initial ekg showed a very rapid rate at 247 bpm, possible 1:1 atrial flutter.  Pt was placed on a diltiazem drip in the ER, with initially a SVT, then afib, then SR. She was placed on eliquis for a CHA2DS2VASc  score of 2 as well as metoprolol 50 mg daily. Echo showed LV systolic function at 55-60%, no significant valvular disease. Dr. Mayford Knife saw on consult. She does use tobacco, alcohol, excessive caffeine.   Today, she denies symptoms of palpitations, chest pain, shortness of breath, orthopnea, PND, lower extremity edema, dizziness, presyncope, syncope, or neurologic sequela. The patient is tolerating medications without difficulties and is otherwise without complaint today.   Past Medical History:  Diagnosis Date   Arthritis    Chronic pain disorder    Complication of anesthesia    WITH LAST CERVICAL SURGERY PT VERY SICK N+V   DDD (degenerative disc disease), cervical    CERV+ LUMBAR   Depression    Gallstones    HLD (hyperlipidemia)    pt denies   Migraines    PONV (postoperative nausea and vomiting)    Sleep apnea    on CPAP  NOT [place] TO USE  (USING [employer] MOUTH PIECE)   UTI (lower urinary tract infection)    HX  UTI     Past Surgical History:  Procedure Laterality Date   2nd lumbar fusion  2001   ABDOMINAL HYSTERECTOMY     CERVICAL DISC SURGERY     2013   FUSION   CHOLECYSTECTOMY     L4,L5,S1,S2 fusion  1998   left shoulder arthroscopy Left 09/08/08   Leandra Kern MD, frozen shoulder   NASAL SINUS SURGERY     1980S   PERIFORMIS     2001 OR 02   POSTERIOR CERVICAL FUSION/FORAMINOTOMY N/A 10/03/2015   Procedure: POSTERIOR CERVICAL  FUSION/FORAMINOTOMY CERVICAL SIX-SEVEN WITH LATERAL MASS FIXATION;  Surgeon: Tressie Stalker, MD;  Location: [facility];  Service: Neurosurgery;  Laterality: N/A;   ROTATOR CUFF REPAIR Right 2002   S1 fusion  2000 and 2001   SACROILIAC JOINT FUSION     TONSILLECTOMY      Current Outpatient Medications  Medication Sig Dispense Refill   Alpha-Lipoic Acid 600 MG CAPS Take 600 mg by mouth daily.     apixaban (ELIQUIS) 5 MG TABS tablet Take 1 tablet (5 mg total) by mouth 2 (two) times daily. 60 tablet 0   baclofen (LIORESAL) 20 MG tablet Take 20 mg by mouth every 8 (eight) hours.     busPIRone (BUSPAR) 15 MG tablet Take 15 mg by mouth at bedtime.     fluticasone (FLONASE) 50 MCG/ACT nasal spray Place 2 sprays into both nostrils at bedtime.     HYDROmorphone (DILAUDID) 4 MG tablet Take 4 mg by mouth in the morning, at noon, in the evening, and at bedtime.     levocetirizine (XYZAL) 5 MG tablet Take 5 mg by mouth daily in the afternoon.     metoprolol succinate (TOPROL-XL) 50 MG 24 hr tablet Take 1 tablet (50 mg total) by mouth daily. Take with or immediately following a meal. 30 tablet 0   MOVANTIK 12.5 MG  TABS tablet Take 12.5 mg by mouth daily.     Polyethyl Glycol-Propyl Glycol (SYSTANE OP) Apply 2 drops to eye in the morning, at noon, and at bedtime.     tamsulosin (FLOMAX) 0.4 MG CAPS capsule Take 0.4 mg by mouth daily.     tapentadol (NUCYNTA) 50 MG tablet Take 50 mg by mouth every 12 (twelve) hours.     traZODone (DESYREL) 150 MG tablet Take 150 mg by mouth at bedtime.     XTAMPZA ER 36 MG C12A Take 1 capsule by mouth every 8 (eight) hours.     No current facility-administered medications for this encounter.    Allergies  Allergen Reactions   Botox [Botulinum Toxin Type A] Swelling   Bactrim [Sulfamethoxazole-Trimethoprim] Rash    Double strength    Penicillins Rash    Has patient had a PCN reaction causing immediate rash, facial/tongue/throat swelling, SOB or lightheadedness  with hypotension: Yes Has patient had a PCN reaction causing severe rash involving mucus membranes or skin necrosis: No Has patient had a PCN reaction that required hospitalization No Has patient had a PCN reaction occurring within the last 10 years: No If all of the above answers are "NO", then may proceed with Cephalosporin use.     Social History   Socioeconomic History   Marital status: Married    Spouse name: Not on file   Number of children: 2   Years of education: Not on file   Highest education level: Not on file  Occupational History   Not on file  Tobacco Use   Smoking status: Never   Smokeless tobacco: Never   Tobacco comments:    tobacco use - no  Substance and Sexual Activity   Alcohol use: No   Drug use: No   Sexual activity: Not on file  Other Topics Concern   Not on file  Social History Narrative   Married, disabled 43    Social Determinants of Health   Financial Resource Strain: Not on file  Food Insecurity: Not on file  Transportation Needs: Not on file  Physical Activity: Not on file  Stress: Not on file  Social Connections: Not on file  Intimate Partner Violence: Not on file    Family History  Problem Relation Age of Onset   Stroke Father    Heart disease Father    COPD Father        smoker   Alzheimer's disease Mother    Migraines Mother    Diabetes Paternal Grandfather    Diabetes Paternal Grandmother    Diabetes Maternal Grandfather    Colon cancer Neg Hx     ROS- All systems are reviewed and negative except as per the HPI above  Physical Exam: There were no vitals filed for this visit. Wt Readings from Last 3 Encounters:  02/28/22 81.6 kg  10/03/15 83.9 kg  09/29/15 84.4 kg    Labs: Lab Results  Component Value Date   NA 142 03/01/2022   K 4.1 03/01/2022   CL 109 03/01/2022   CO2 27 03/01/2022   GLUCOSE 106 (H) 03/01/2022   BUN 16 03/01/2022   CREATININE 0.97 03/01/2022   CALCIUM 8.9 03/01/2022   MG 2.3 02/28/2022    Lab Results  Component Value Date   INR 1.2 03/01/2022   Lab Results  Component Value Date   CHOL 111 04/21/2008   HDL 83.7 04/21/2008   LDLCALC 20 04/21/2008   TRIG 38 04/21/2008     GEN- The  patient is well appearing, alert and oriented x 3 today.   Head- normocephalic, atraumatic Eyes-  Sclera clear, conjunctiva pink Ears- hearing intact Oropharynx- clear Neck- supple, no JVP Lymph- no cervical lymphadenopathy Lungs- Clear to ausculation bilaterally, normal work of breathing Heart- Regular rate and rhythm, no murmurs, rubs or gallops, PMI not laterally displaced GI- soft, NT, ND, + BS Extremities- no clubbing, cyanosis, or edema MS- no significant deformity or atrophy Skin- no rash or lesion Psych- euthymic mood, full affect Neuro- strength and sensation are intact  EKG-Vent. rate 247 BPM - initial ekg in ED 7/26 PR interval 148 ms QRS duration 94 ms QT/QTcB 212/430 ms P-R-T axes 117 -37 206 Supraventricular tachycardia Left axis deviation Repolarization abnormality, prob rate related  EKG today -Vent. rate 85 BPM  PR interval 152 ms QRS duration 96 ms QT/QTcB 394/468 ms P-R-T axes 55 33 54 Normal sinus rhythm Normal ECG When compared with ECG of 28-Feb-2022 15:43, PREVIOUS ECG IS PRESENT  Echo-1. Left ventricular ejection fraction, by estimation, is 55 to 60%. The  left ventricle has normal function. The left ventricle has no regional  wall motion abnormalities. Left ventricular diastolic parameters were  normal.   2. Right ventricular systolic function is normal. The right ventricular  size is normal. Tricuspid regurgitation signal is inadequate for assessing  PA pressure.   3. The mitral valve is normal in structure. Trivial mitral valve  regurgitation. No evidence of mitral stenosis.   4. The aortic valve is tricuspid. Aortic valve regurgitation is not  visualized. No aortic stenosis is present.   5. The inferior vena cava is normal in size with  <50% respiratory  variability, suggesting right atrial pressure of 8 mmHg.   Assessment and Plan:  1. ? SVT/ Afib/ Aflutter  General education re afib  Triggers discussed, no triggers identified  Continue metoprolol ER at 50 mg daily   2. CHA2DS2VASc  score of at least 2  Continue eliquis  5 mg bid May not have to use long term    I reviewed initial ED ekg with Dr. Curt Bears as to etiology, he felt  too fast to actually call rhythm, but  will send to EP for further evaluation with impressive presenting EKG.  Geroge Baseman Hinata Diener, [facility] [address] [place], [place] [address] [phone]

## 2022-03-25 DIAGNOSIS — G4733 Obstructive sleep apnea (adult) (pediatric): Secondary | ICD-10-CM | POA: Diagnosis not present

## 2022-03-28 DIAGNOSIS — G894 Chronic pain syndrome: Secondary | ICD-10-CM | POA: Diagnosis not present

## 2022-03-28 DIAGNOSIS — M961 Postlaminectomy syndrome, not elsewhere classified: Secondary | ICD-10-CM | POA: Diagnosis not present

## 2022-03-28 DIAGNOSIS — Z79891 Long term (current) use of opiate analgesic: Secondary | ICD-10-CM | POA: Diagnosis not present

## 2022-03-28 DIAGNOSIS — M47812 Spondylosis without myelopathy or radiculopathy, cervical region: Secondary | ICD-10-CM | POA: Diagnosis not present

## 2022-04-25 DIAGNOSIS — G4733 Obstructive sleep apnea (adult) (pediatric): Secondary | ICD-10-CM | POA: Diagnosis not present

## 2022-04-25 DIAGNOSIS — Z79891 Long term (current) use of opiate analgesic: Secondary | ICD-10-CM | POA: Diagnosis not present

## 2022-04-25 DIAGNOSIS — G894 Chronic pain syndrome: Secondary | ICD-10-CM | POA: Diagnosis not present

## 2022-04-25 DIAGNOSIS — M47812 Spondylosis without myelopathy or radiculopathy, cervical region: Secondary | ICD-10-CM | POA: Diagnosis not present

## 2022-04-25 DIAGNOSIS — M961 Postlaminectomy syndrome, not elsewhere classified: Secondary | ICD-10-CM | POA: Diagnosis not present

## 2022-04-27 ENCOUNTER — Encounter: Payer: Self-pay | Admitting: Cardiology

## 2022-04-27 ENCOUNTER — Encounter: Payer: Self-pay | Admitting: *Deleted

## 2022-04-27 ENCOUNTER — Ambulatory Visit: Payer: Medicare Other | Attending: Cardiology | Admitting: Cardiology

## 2022-04-27 VITALS — BP 134/78 | Ht 68.5 in | Wt 185.0 lb

## 2022-04-27 DIAGNOSIS — I471 Supraventricular tachycardia: Secondary | ICD-10-CM

## 2022-04-27 DIAGNOSIS — I4891 Unspecified atrial fibrillation: Secondary | ICD-10-CM

## 2022-04-27 NOTE — Progress Notes (Deleted)
Electrophysiology Office Note:    Date:  04/27/2022   ID:  Melody Huerta, Melody Huerta 06/03/1951, MRN [id number]  PCP:  Orpah Melter, MD  [facility] Cardiologist:  Fransico Him, MD  [place] Electrophysiologist:  None   Referring MD: Sherran Needs, NP   Chief Complaint: Atrial fibrillation  History of Present Illness:    Melody Huerta is a 71 y.o. female who presents for an evaluation of atrial fibrillation at the request of Roderic Palau, NP. Their medical history includes osteoarthritis, chronic pain, depression, hyperlipidemia, migraines.  The patient was first diagnosed with atrial fibrillation and flutter July 26 after reporting tachycardia while at her pain clinic.  She was referred to the ER where a diltiazem drip was started that ultimately converted her back to sinus rhythm.  She is on Eliquis for stroke prophylaxis.  She takes Toprol-XL.     Past Medical History:  Diagnosis Date   Arthritis    Chronic pain disorder    Complication of anesthesia    WITH LAST CERVICAL SURGERY PT VERY SICK N+V   DDD (degenerative disc disease), cervical    CERV+ LUMBAR   Depression    Gallstones    HLD (hyperlipidemia)    pt denies   Migraines    PONV (postoperative nausea and vomiting)    Sleep apnea    on CPAP  NOT [place] TO USE  (USING [employer] MOUTH PIECE)   UTI (lower urinary tract infection)    HX  UTI      Past Surgical History:  Procedure Laterality Date   2nd lumbar fusion  2001   [place]     2013   FUSION   CHOLECYSTECTOMY     L4,L5,S1,S2 fusion  1998   left shoulder arthroscopy Left 09/08/08   Donnald Garre MD, frozen shoulder   NASAL SINUS SURGERY     1980S   PERIFORMIS     2001 OR 02   POSTERIOR CERVICAL FUSION/FORAMINOTOMY N/A 10/03/2015   Procedure: POSTERIOR CERVICAL FUSION/FORAMINOTOMY CERVICAL SIX-SEVEN WITH LATERAL MASS FIXATION;  Surgeon: Newman Pies, MD;  Location: [place] [facility]UR[facility] ORS;  Service: Neurosurgery;   Laterality: N/A;   ROTATOR CUFF REPAIR Right 2002   S1 fusion  2000 and 2001   SACROILIAC JOINT FUSION     TONSILLECTOMY      Current Medications: No outpatient medications have been marked as taking for the 04/27/22 encounter (Appointment) with Vickie Epley, MD.     Allergies:   Botox [botulinum toxin type a], Bactrim [sulfamethoxazole-trimethoprim], and Penicillins   Social History   Socioeconomic History   Marital status: Married    Spouse name: Not on file   Number of children: 2   Years of education: Not on file   Highest education level: Not on file  Occupational History   Not on file  Tobacco Use   Smoking status: Never   Smokeless tobacco: Never   Tobacco comments:    tobacco use - no  Substance and Sexual Activity   Alcohol use: No   Drug use: No   Sexual activity: Not on file  Other Topics Concern   Not on file  Social History Narrative   Married, disabled 1998    Social Determinants of Health   Financial Resource Strain: Not on file  Food Insecurity: Not on file  Transportation Needs: Not on file  Physical Activity: Not on file  Stress: Not on file  Social Connections: Not  on file     Family History: The patient's family history includes Alzheimer's disease in her mother; COPD in her father; Diabetes in her maternal grandfather, paternal grandfather, and paternal grandmother; Heart disease in her father; Migraines in her mother; Stroke in her father. There is no history of Colon cancer.  ROS:   Please see the history of present illness.    All other systems reviewed and are negative.  EKGs/Labs/Other Studies Reviewed:    The following studies were reviewed today:  March 01, 2022 echo EF 55% RV normal Trivial MR  February 28, 2022 EKG shows narrow complex tachycardia with a ventricular rate of 247 bpm.  The QRS transitions in V5.  QRS complex is a same morphology as her sinus rhythm EKG.  February 28, 2022 EKG #2 shows atrial fibrillation with a  similar QRS axis as the tachycardia  July 26 the EKG #3 shows sinus rhythm with a ventricular rate of 78 bpm.  No preexcitation.     Recent Labs: 02/28/2022: Magnesium 2.3; TSH 0.693 03/01/2022: BUN 16; Creatinine, Ser 0.97; Hemoglobin 12.9; Platelets 237; Potassium 4.1; Sodium 142  Recent Lipid Panel    Component Value Date/Time   CHOL 111 04/21/2008 1410   TRIG 38 04/21/2008 1410   HDL 83.7 04/21/2008 1410   CHOLHDL 1.3 CALC 04/21/2008 1410   VLDL 8 04/21/2008 1410   LDLCALC 20 04/21/2008 1410    Physical Exam:    VS:  There were no vitals taken for this visit.    Wt Readings from Last 3 Encounters:  03/08/22 185 lb 9.6 oz (84.2 kg)  02/28/22 180 lb (81.6 kg)  10/03/15 185 lb (83.9 kg)     GEN: *** Well nourished, well developed in no acute distress HEENT: Normal NECK: No JVD; No carotid bruits LYMPHATICS: No lymphadenopathy CARDIAC: ***RRR, no murmurs, rubs, gallops RESPIRATORY:  Clear to auscultation without rales, wheezing or rhonchi  ABDOMEN: Soft, non-tender, non-distended MUSCULOSKELETAL:  No edema; No deformity  SKIN: Warm and dry NEUROLOGIC:  Alert and oriented x 3 PSYCHIATRIC:  Normal affect       ASSESSMENT:    1. Atrial fibrillation, unspecified type ([place])   2. SVT (supraventricular tachycardia) (HCC)    PLAN:    In order of problems listed above:  #SVT #A-fib I suspect her episode was secondary to an SVT that degenerated into atrial fibrillation.  Is unclear to me whether or not she also has a diagnosis of primary atrial fibrillation.  I discussed treatment options for her including continuing the beta-blocker versus pursuing EP study and ablation.  Given the speed of her ventricular rate during the episode I would favor a firm diagnosis with possible definitive management using EP study and ablation. ***       Total time spent with patient today *** minutes. This includes reviewing records, evaluating the patient and coordinating  care.  Medication Adjustments/Labs and Tests Ordered: Current medicines are reviewed at length with the patient today.  Concerns regarding medicines are outlined above.  No orders of the defined types were placed in this encounter.  No orders of the defined types were placed in this encounter.    Signed, Hilton Cork. Quentin Ore, MD, [facility], [facility] 04/27/2022 7:48 AM    Electrophysiology [facility]

## 2022-04-27 NOTE — Progress Notes (Signed)
Electrophysiology Office Note:    Date:  04/27/2022   ID:  Melody Huerta, Melody Huerta 1951-02-18, MRN [id number]  PCP:  Orpah Melter, MD  [facility] Cardiologist:  Fransico Him, MD  [facility] Electrophysiologist:  Vickie Epley, MD   Referring MD: Sherran Needs, NP   Chief Complaint: Atrial fibrillation  History of Present Illness:    Melody Huerta is a 71 y.o. female who presents for an evaluation of atrial fibrillation at the request of Roderic Palau, NP. Their medical history includes osteoarthritis, chronic pain, depression, hyperlipidemia, migraines.  The patient was first diagnosed with atrial fibrillation and flutter July 26 after reporting tachycardia while at her pain clinic.  She was referred to the ER where a diltiazem drip was started that ultimately converted her back to sinus rhythm.  She is on Eliquis for stroke prophylaxis.  She takes Toprol-XL.  Today, she states that she was feeling "off" for a couple days prior to her tachycardic episode while at the pain clinic. She was not sure what she was feeling since she also suffers from chronic severe pain that she rates as a 9-10/10. During that tachycardic event she did feel associated lightheadedness, but she denies feeling presyncopal. She had never experienced previous episodes of this nature.  Additionally she complains of headaches, which she believes may be caused by her Eliquis or metoprolol. She confirms easy bruising while on Eliquis.  She denies any chest pain, shortness of breath, or peripheral edema. No orthopnea, or PND.     Past Medical History:  Diagnosis Date   Arthritis    Chronic pain disorder    Complication of anesthesia    WITH LAST CERVICAL SURGERY PT VERY SICK N+V   DDD (degenerative disc disease), cervical    CERV+ LUMBAR   Depression    Gallstones    HLD (hyperlipidemia)    pt denies   Migraines    PONV (postoperative nausea and vomiting)    Sleep apnea    on CPAP  NOT [place] TO  USE  (USING [employer]ES MOUTH PIECE)   UTI (lower urinary tract infection)    HX  UTI      Past Surgical History:  Procedure Laterality Date   2nd lumbar fusion  2001   [place]     2013   FUSION   CHOLECYSTECTOMY     L4,L5,S1,S2 fusion  1998   left shoulder arthroscopy Left 09/08/08   Donnald Garre MD, frozen shoulder   NASAL SINUS SURGERY     1980S   PERIFORMIS     2001 OR 02   POSTERIOR CERVICAL FUSION/FORAMINOTOMY N/A 10/03/2015   Procedure: POSTERIOR CERVICAL FUSION/FORAMINOTOMY CERVICAL SIX-SEVEN WITH LATERAL MASS FIXATION;  Surgeon: Newman Pies, MD;  Location: [place] [facility] OR[facility];  Service: Neurosurgery;  Laterality: N/A;   ROTATOR CUFF REPAIR Right 2002   S1 fusion  2000 and 2001   SACROILIAC JOINT FUSION     TONSILLECTOMY      Current Medications: Current Meds  Medication Sig   Alpha-Lipoic Acid 600 MG CAPS Take 600 mg by mouth daily.   baclofen (LIORESAL) 20 MG tablet Take 20 mg by mouth every 8 (eight) hours.   busPIRone (BUSPAR) 15 MG tablet Take 15 mg by mouth at bedtime.   fluticasone (FLONASE) 50 MCG/ACT nasal spray Place 2 sprays into both nostrils at bedtime.   HYDROmorphone (DILAUDID) 4 MG tablet Take 4 mg by mouth in the morning, at  noon, in the evening, and at bedtime.   levocetirizine (XYZAL) 5 MG tablet Take 5 mg by mouth daily in the afternoon.   MOVANTIK 12.5 MG TABS tablet Take 12.5 mg by mouth daily.   Polyethyl Glycol-Propyl Glycol (SYSTANE OP) Apply 2 drops to eye in the morning, at noon, and at bedtime.   tamsulosin (FLOMAX) 0.4 MG CAPS capsule Take 0.4 mg by mouth daily.   tapentadol (NUCYNTA) 50 MG tablet Take 50 mg by mouth every 12 (twelve) hours.   traZODone (DESYREL) 150 MG tablet Take 150 mg by mouth at bedtime.   XTAMPZA ER 36 MG C12A Take 1 capsule by mouth every 8 (eight) hours.     Allergies:   Botox [botulinum toxin type a], Bactrim [sulfamethoxazole-trimethoprim], and Penicillins   Social History    Socioeconomic History   Marital status: Married    Spouse name: Not on file   Number of children: 2   Years of education: Not on file   Highest education level: Not on file  Occupational History   Not on file  Tobacco Use   Smoking status: Never   Smokeless tobacco: Never   Tobacco comments:    tobacco use - no  Substance and Sexual Activity   Alcohol use: No   Drug use: No   Sexual activity: Not on file  Other Topics Concern   Not on file  Social History Narrative   Married, disabled 24    Social Determinants of Health   Financial Resource Strain: Not on file  Food Insecurity: Not on file  Transportation Needs: Not on file  Physical Activity: Not on file  Stress: Not on file  Social Connections: Not on file     Family History: The patient's family history includes Alzheimer's disease in her mother; COPD in her father; Diabetes in her maternal grandfather, paternal grandfather, and paternal grandmother; Heart disease in her father; Migraines in her mother; Stroke in her father. There is no history of Colon cancer.  ROS:   Please see the history of present illness.    (+) Severe chronic myalgias (+) Headaches (+) Easy bruising All other systems reviewed and are negative.  EKGs/Labs/Other Studies Reviewed:    The following studies were reviewed today:  March 01, 2022 echo EF 55% RV normal Trivial MR   February 28, 2022 EKG shows narrow complex tachycardia with a ventricular rate of 247 bpm.  The QRS transitions in V5.  QRS complex is a same morphology as her sinus rhythm EKG.   February 28, 2022 EKG #2 shows atrial fibrillation with a similar QRS axis as the tachycardia   July 26 the EKG #3 shows sinus rhythm with a ventricular rate of 78 bpm.  No preexcitation.   Recent Labs: 02/28/2022: Magnesium 2.3; TSH 0.693 03/01/2022: BUN 16; Creatinine, Ser 0.97; Hemoglobin 12.9; Platelets 237; Potassium 4.1; Sodium 142   Recent Lipid Panel    Component Value Date/Time    CHOL 111 04/21/2008 1410   TRIG 38 04/21/2008 1410   HDL 83.7 04/21/2008 1410   CHOLHDL 1.3 CALC 04/21/2008 1410   VLDL 8 04/21/2008 1410   LDLCALC 20 04/21/2008 1410    Physical Exam:    VS:  BP 134/78   Ht 5' 8.5" (1.74 m)   Wt 185 lb (83.9 kg)   SpO2 94%   BMI 27.72 kg/m     Wt Readings from Last 3 Encounters:  04/27/22 185 lb (83.9 kg)  03/08/22 185 lb 9.6 oz (84.2 kg)  02/28/22 180 lb (81.6 kg)     GEN: Well nourished, well developed in no acute distress HEENT: Normal NECK: No JVD; No carotid bruits LYMPHATICS: No lymphadenopathy CARDIAC: RRR, no murmurs, rubs, gallops RESPIRATORY:  Clear to auscultation without rales, wheezing or rhonchi  ABDOMEN: Soft, non-tender, non-distended MUSCULOSKELETAL:  No edema; No deformity  SKIN: Warm and dry NEUROLOGIC:  Alert and oriented x 3 PSYCHIATRIC:  Normal affect       ASSESSMENT:    1. Atrial fibrillation, unspecified type ([place])   2. SVT (supraventricular tachycardia) (HCC)    PLAN:    In order of problems listed above:  #SVT #A-fib I suspect her episode was secondary to an SVT that degenerated into atrial fibrillation.  Differential includes AVNRT, AVRT and AFL. AT is possible but less likely. It is unclear to me whether or not she also has a diagnosis of primary atrial fibrillation.  I discussed treatment options for her including continuing the beta-blocker versus pursuing EP study and ablation.  Given the speed of her ventricular rate during the episode I would favor a firm diagnosis with possible definitive management using EP study and ablation.   Ablation strategy would be an EP study with ablation of any induced SVT. If AF is only arrhythmia induced, plan to ablate the pulmonary veins.   Therapeutic strategies for supraventricular tachycardia including medicine and ablation were discussed in detail with the patient today. Risk, benefits, and alternatives to EP study and radiofrequency ablation were also  discussed in detail today. These risks include but are not limited to stroke, bleeding, vascular damage, tamponade, perforation, damage to the heart and other structures, AV block requiring pacemaker, worsening renal function, and death. The patient understands these risk and wishes to proceed.  We will therefore proceed with catheter ablation at the next available time.  Hold metoprolol for 5 days prior to EP study and ablation.     Medication Adjustments/Labs and Tests Ordered: Current medicines are reviewed at length with the patient today.  Concerns regarding medicines are outlined above.   Orders Placed This Encounter  Procedures   CBC w/Diff   Basic Metabolic Panel (BMET)   No orders of the defined types were placed in this encounter.   I,Mathew Stumpf,acting as a Education administrator for Vickie Epley, MD.,have documented all relevant documentation on the behalf of Vickie Epley, MD,as directed by  Vickie Epley, MD while in the presence of Vickie Epley, MD.  I, Vickie Epley, MD, have reviewed all documentation for this visit. The documentation on 04/27/22 for the exam, diagnosis, procedures, and orders are all accurate and complete.   Signed, Hilton Cork. Quentin Ore, MD, [facility] 04/27/2022 9:48 PM    Electrophysiology [facility]

## 2022-04-27 NOTE — H&P (View-Only) (Signed)
Electrophysiology Office Note:    Date:  04/27/2022   ID:  Opal, Alvillar 12-04-1950, MRN [id number]  PCP:  Orpah Melter, MD  [facility] Cardiologist:  Fransico Him, MD  [facility] Electrophysiologist:  Vickie Epley, MD   Referring MD: Sherran Needs, NP   Chief Complaint: Atrial fibrillation  History of Present Illness:    Melody Huerta is a 71 y.o. female who presents for an evaluation of atrial fibrillation at the request of Roderic Palau, NP. Their medical history includes osteoarthritis, chronic pain, depression, hyperlipidemia, migraines.  The patient was first diagnosed with atrial fibrillation and flutter July 26 after reporting tachycardia while at her pain clinic.  She was referred to the ER where a diltiazem drip was started that ultimately converted her back to sinus rhythm.  She is on Eliquis for stroke prophylaxis.  She takes Toprol-XL.  Today, she states that she was feeling "off" for a couple days prior to her tachycardic episode while at the pain clinic. She was not sure what she was feeling since she also suffers from chronic severe pain that she rates as a 9-10/10. During that tachycardic event she did feel associated lightheadedness, but she denies feeling presyncopal. She had never experienced previous episodes of this nature.  Additionally she complains of headaches, which she believes may be caused by her Eliquis or metoprolol. She confirms easy bruising while on Eliquis.  She denies any chest pain, shortness of breath, or peripheral edema. No orthopnea, or PND.     Past Medical History:  Diagnosis Date   Arthritis    Chronic pain disorder    Complication of anesthesia    WITH LAST CERVICAL SURGERY PT VERY SICK N+V   DDD (degenerative disc disease), cervical    CERV+ LUMBAR   Depression    Gallstones    HLD (hyperlipidemia)    pt denies   Migraines    PONV (postoperative nausea and vomiting)    Sleep apnea    on CPAP  NOT [place] TO  USE  (USING MOSES MOUTH PIECE)   UTI (lower urinary tract infection)    HX  UTI      Past Surgical History:  Procedure Laterality Date   2nd lumbar fusion  2001   [place]     2013   FUSION   CHOLECYSTECTOMY     L4,L5,S1,S2 fusion  1998   left shoulder arthroscopy Left 09/08/08   Donnald Garre MD, frozen shoulder   NASAL SINUS SURGERY     1980S   PERIFORMIS     2001 OR 02   POSTERIOR CERVICAL FUSION/FORAMINOTOMY N/A 10/03/2015   Procedure: POSTERIOR CERVICAL FUSION/FORAMINOTOMY CERVICAL SIX-SEVEN WITH LATERAL MASS FIXATION;  Surgeon: Newman Pies, MD;  Location: [facility];  Service: Neurosurgery;  Laterality: N/A;   ROTATOR CUFF REPAIR Right 2002   S1 fusion  2000 and 2001   SACROILIAC JOINT FUSION     TONSILLECTOMY      Current Medications: Current Meds  Medication Sig   Alpha-Lipoic Acid 600 MG CAPS Take 600 mg by mouth daily.   baclofen (LIORESAL) 20 MG tablet Take 20 mg by mouth every 8 (eight) hours.   busPIRone (BUSPAR) 15 MG tablet Take 15 mg by mouth at bedtime.   fluticasone (FLONASE) 50 MCG/ACT nasal spray Place 2 sprays into both nostrils at bedtime.   HYDROmorphone (DILAUDID) 4 MG tablet Take 4 mg by mouth in the morning, at  noon, in the evening, and at bedtime.   levocetirizine (XYZAL) 5 MG tablet Take 5 mg by mouth daily in the afternoon.   MOVANTIK 12.5 MG TABS tablet Take 12.5 mg by mouth daily.   Polyethyl Glycol-Propyl Glycol (SYSTANE OP) Apply 2 drops to eye in the morning, at noon, and at bedtime.   tamsulosin (FLOMAX) 0.4 MG CAPS capsule Take 0.4 mg by mouth daily.   tapentadol (NUCYNTA) 50 MG tablet Take 50 mg by mouth every 12 (twelve) hours.   traZODone (DESYREL) 150 MG tablet Take 150 mg by mouth at bedtime.   XTAMPZA ER 36 MG C12A Take 1 capsule by mouth every 8 (eight) hours.     Allergies:   Botox [botulinum toxin type a], Bactrim [sulfamethoxazole-trimethoprim], and Penicillins   Social History    Socioeconomic History   Marital status: Married    Spouse name: Not on file   Number of children: 2   Years of education: Not on file   Highest education level: Not on file  Occupational History   Not on file  Tobacco Use   Smoking status: Never   Smokeless tobacco: Never   Tobacco comments:    tobacco use - no  Substance and Sexual Activity   Alcohol use: No   Drug use: No   Sexual activity: Not on file  Other Topics Concern   Not on file  Social History Narrative   Married, disabled 89    Social Determinants of Health   Financial Resource Strain: Not on file  Food Insecurity: Not on file  Transportation Needs: Not on file  Physical Activity: Not on file  Stress: Not on file  Social Connections: Not on file     Family History: The patient's family history includes Alzheimer's disease in her mother; COPD in her father; Diabetes in her maternal grandfather, paternal grandfather, and paternal grandmother; Heart disease in her father; Migraines in her mother; Stroke in her father. There is no history of Colon cancer.  ROS:   Please see the history of present illness.    (+) Severe chronic myalgias (+) Headaches (+) Easy bruising All other systems reviewed and are negative.  EKGs/Labs/Other Studies Reviewed:    The following studies were reviewed today:  March 01, 2022 echo EF 55% RV normal Trivial MR   February 28, 2022 EKG shows narrow complex tachycardia with a ventricular rate of 247 bpm.  The QRS transitions in V5.  QRS complex is a same morphology as her sinus rhythm EKG.   February 28, 2022 EKG #2 shows atrial fibrillation with a similar QRS axis as the tachycardia   July 26 the EKG #3 shows sinus rhythm with a ventricular rate of 78 bpm.  No preexcitation.   Recent Labs: 02/28/2022: Magnesium 2.3; TSH 0.693 03/01/2022: BUN 16; Creatinine, Ser 0.97; Hemoglobin 12.9; Platelets 237; Potassium 4.1; Sodium 142   Recent Lipid Panel    Component Value Date/Time    CHOL 111 04/21/2008 1410   TRIG 38 04/21/2008 1410   HDL 83.7 04/21/2008 1410   CHOLHDL 1.3 CALC 04/21/2008 1410   VLDL 8 04/21/2008 1410   LDLCALC 20 04/21/2008 1410    Physical Exam:    VS:  BP 134/78   Ht 5' 8.5" (1.74 m)   Wt 185 lb (83.9 kg)   SpO2 94%   BMI 27.72 kg/m     Wt Readings from Last 3 Encounters:  04/27/22 185 lb (83.9 kg)  03/08/22 185 lb 9.6 oz (84.2 kg)  02/28/22 180 lb (81.6 kg)     GEN: Well nourished, well developed in no acute distress HEENT: Normal NECK: No JVD; No carotid bruits LYMPHATICS: No lymphadenopathy CARDIAC: RRR, no murmurs, rubs, gallops RESPIRATORY:  Clear to auscultation without rales, wheezing or rhonchi  ABDOMEN: Soft, non-tender, non-distended MUSCULOSKELETAL:  No edema; No deformity  SKIN: Warm and dry NEUROLOGIC:  Alert and oriented x 3 PSYCHIATRIC:  Normal affect       ASSESSMENT:    1. Atrial fibrillation, unspecified type ([place])   2. SVT (supraventricular tachycardia) (HCC)    PLAN:    In order of problems listed above:  #SVT #A-fib I suspect her episode was secondary to an SVT that degenerated into atrial fibrillation.  Differential includes AVNRT, AVRT and AFL. AT is possible but less likely. It is unclear to me whether or not she also has a diagnosis of primary atrial fibrillation.  I discussed treatment options for her including continuing the beta-blocker versus pursuing EP study and ablation.  Given the speed of her ventricular rate during the episode I would favor a firm diagnosis with possible definitive management using EP study and ablation.   Ablation strategy would be an EP study with ablation of any induced SVT. If AF is only arrhythmia induced, plan to ablate the pulmonary veins.   Therapeutic strategies for supraventricular tachycardia including medicine and ablation were discussed in detail with the patient today. Risk, benefits, and alternatives to EP study and radiofrequency ablation were also  discussed in detail today. These risks include but are not limited to stroke, bleeding, vascular damage, tamponade, perforation, damage to the heart and other structures, AV block requiring pacemaker, worsening renal function, and death. The patient understands these risk and wishes to proceed.  We will therefore proceed with catheter ablation at the next available time.  Hold metoprolol for 5 days prior to EP study and ablation.     Medication Adjustments/Labs and Tests Ordered: Current medicines are reviewed at length with the patient today.  Concerns regarding medicines are outlined above.   Orders Placed This Encounter  Procedures   CBC w/Diff   Basic Metabolic Panel (BMET)   No orders of the defined types were placed in this encounter.   I,Mathew Stumpf,acting as a Education administrator for Vickie Epley, MD.,have documented all relevant documentation on the behalf of Vickie Epley, MD,as directed by  Vickie Epley, MD while in the presence of Vickie Epley, MD.  I, Vickie Epley, MD, have reviewed all documentation for this visit. The documentation on 04/27/22 for the exam, diagnosis, procedures, and orders are all accurate and complete.   Signed, Hilton Cork. Quentin Ore, MD, [facility] 04/27/2022 9:48 PM    Electrophysiology [facility]

## 2022-04-27 NOTE — Patient Instructions (Signed)
Medication Instructions:  none *If you need a refill on your cardiac medications before your next appointment, please call your pharmacy*   Lab Work: CBC, BMP  If you have labs (blood work) drawn today and your tests are completely normal, you will receive your results only by: MyChart Message (if you have MyChart) OR A paper copy in the mail If you have any lab test that is abnormal or we need to change your treatment, we will call you to review the results.   Testing/Procedures: Your physician has recommended that you have an ablation. Catheter ablation is a medical procedure used to treat some cardiac arrhythmias (irregular heartbeats). During catheter ablation, a long, thin, flexible tube is put into a blood vessel in your groin (upper thigh), or neck. This tube is called an ablation catheter. It is then guided to your heart through the blood vessel. Radio frequency waves destroy small areas of heart tissue where abnormal heartbeats may cause an arrhythmia to start. Please see the instruction sheet given to you today.   Follow-Up: At [facility], you and your health needs are our priority.  As part of our continuing mission to provide you with exceptional heart care, we have created designated Provider Care Teams.  These Care Teams include your primary Cardiologist (physician) and Advanced Practice Providers (APPs -  Physician Assistants and Nurse Practitioners) who all work together to provide you with the care you need, when you need it.  We recommend signing up for the patient portal called "MyChart".  Sign up information is provided on this After Visit Summary.  MyChart is used to connect with patients for Virtual Visits (Telemedicine).  Patients are [place] to view lab/test results, encounter notes, upcoming appointments, etc.  Non-urgent messages can be sent to your provider as well.   To learn more about what you can do with MyChart, go to [email or web address].    Your next  appointment:   See instruction letter.  Important Information About Sugar       

## 2022-05-02 ENCOUNTER — Encounter: Payer: Self-pay | Admitting: Cardiology

## 2022-05-02 ENCOUNTER — Ambulatory Visit: Payer: Medicare Other | Attending: Cardiology

## 2022-05-02 DIAGNOSIS — I4891 Unspecified atrial fibrillation: Secondary | ICD-10-CM | POA: Diagnosis not present

## 2022-05-02 DIAGNOSIS — I471 Supraventricular tachycardia: Secondary | ICD-10-CM | POA: Diagnosis not present

## 2022-05-03 LAB — CBC WITH DIFFERENTIAL/PLATELET
Basophils Absolute: 0.1 10*3/uL (ref 0.0–0.2)
Basos: 1 %
EOS (ABSOLUTE): 0.2 10*3/uL (ref 0.0–0.4)
Eos: 2 %
Hematocrit: 42.6 % (ref 34.0–46.6)
Hemoglobin: 14.4 g/dL (ref 11.1–15.9)
Immature Grans (Abs): 0 10*3/uL (ref 0.0–0.1)
Immature Granulocytes: 1 %
Lymphocytes Absolute: 3.5 10*3/uL — ABNORMAL HIGH (ref 0.7–3.1)
Lymphs: 45 %
MCH: 29.4 pg (ref 26.6–33.0)
MCHC: 33.8 g/dL (ref 31.5–35.7)
MCV: 87 fL (ref 79–97)
Monocytes Absolute: 0.6 10*3/uL (ref 0.1–0.9)
Monocytes: 7 %
Neutrophils Absolute: 3.4 10*3/uL (ref 1.4–7.0)
Neutrophils: 44 %
Platelets: 237 10*3/uL (ref 150–450)
RBC: 4.9 x10E6/uL (ref 3.77–5.28)
RDW: 12.4 % (ref 11.7–15.4)
WBC: 7.7 10*3/uL (ref 3.4–10.8)

## 2022-05-03 LAB — BASIC METABOLIC PANEL
BUN/Creatinine Ratio: 14 (ref 12–28)
BUN: 15 mg/dL (ref 8–27)
CO2: 24 mmol/L (ref 20–29)
Calcium: 9.4 mg/dL (ref 8.7–10.3)
Chloride: 104 mmol/L (ref 96–106)
Creatinine, Ser: 1.04 mg/dL — ABNORMAL HIGH (ref 0.57–1.00)
Glucose: 119 mg/dL — ABNORMAL HIGH (ref 70–99)
Potassium: 4 mmol/L (ref 3.5–5.2)
Sodium: 141 mmol/L (ref 134–144)
eGFR: 59 mL/min/{1.73_m2} — ABNORMAL LOW (ref >59)

## 2022-05-11 ENCOUNTER — Encounter: Payer: Self-pay | Admitting: Cardiology

## 2022-05-17 NOTE — Pre-Procedure Instructions (Signed)
Instructed patient on the following items: Arrival time 1200 Nothing to eat or drink after midnight No meds AM of procedure Responsible person to drive you home and stay with you for 24 hrs  Have you missed any doses of anti-coagulant Eliquis- hasn't missed any doses   

## 2022-05-18 ENCOUNTER — Ambulatory Visit (HOSPITAL_COMMUNITY)
Admission: RE | Admit: 2022-05-18 | Discharge: 2022-05-18 | Disposition: A | Discharge status: Home / Self Care | Payer: Medicare Other | Attending: Cardiology | Admitting: Cardiology

## 2022-05-18 ENCOUNTER — Other Ambulatory Visit (HOSPITAL_COMMUNITY): Payer: Self-pay

## 2022-05-18 ENCOUNTER — Ambulatory Visit (HOSPITAL_COMMUNITY)
Admission: RE | Disposition: A | Discharge status: Home / Self Care | Payer: Self-pay | Source: Home / Self Care | Attending: Cardiology

## 2022-05-18 ENCOUNTER — Ambulatory Visit (HOSPITAL_COMMUNITY): Payer: Medicare Other | Admitting: Anesthesiology

## 2022-05-18 ENCOUNTER — Other Ambulatory Visit: Payer: Self-pay

## 2022-05-18 ENCOUNTER — Encounter: Payer: Self-pay | Admitting: Cardiology

## 2022-05-18 ENCOUNTER — Ambulatory Visit (HOSPITAL_BASED_OUTPATIENT_CLINIC_OR_DEPARTMENT_OTHER): Payer: Medicare Other | Admitting: Anesthesiology

## 2022-05-18 ENCOUNTER — Encounter (HOSPITAL_COMMUNITY): Payer: Self-pay | Admitting: Cardiology

## 2022-05-18 DIAGNOSIS — F32A Depression, unspecified: Secondary | ICD-10-CM | POA: Diagnosis not present

## 2022-05-18 DIAGNOSIS — Z79899 Other long term (current) drug therapy: Secondary | ICD-10-CM | POA: Diagnosis not present

## 2022-05-18 DIAGNOSIS — E785 Hyperlipidemia, unspecified: Secondary | ICD-10-CM | POA: Diagnosis not present

## 2022-05-18 DIAGNOSIS — G4730 Sleep apnea, unspecified: Secondary | ICD-10-CM | POA: Diagnosis not present

## 2022-05-18 DIAGNOSIS — Z7901 Long term (current) use of anticoagulants: Secondary | ICD-10-CM | POA: Diagnosis not present

## 2022-05-18 DIAGNOSIS — G8929 Other chronic pain: Secondary | ICD-10-CM | POA: Diagnosis not present

## 2022-05-18 DIAGNOSIS — I471 Supraventricular tachycardia, unspecified: Secondary | ICD-10-CM | POA: Insufficient documentation

## 2022-05-18 DIAGNOSIS — I4891 Unspecified atrial fibrillation: Secondary | ICD-10-CM | POA: Insufficient documentation

## 2022-05-18 DIAGNOSIS — M1990 Unspecified osteoarthritis, unspecified site: Secondary | ICD-10-CM | POA: Diagnosis not present

## 2022-05-18 HISTORY — PX: SVT ABLATION: EP1225

## 2022-05-18 SURGERY — SVT ABLATION
Anesthesia: General

## 2022-05-18 MED ORDER — PHENYLEPHRINE 80 MCG/ML (10ML) SYRINGE FOR IV PUSH (FOR BLOOD PRESSURE SUPPORT)
PREFILLED_SYRINGE | INTRAVENOUS | Status: DC | PRN
  Administered 2022-05-18 (×2): 160 ug via INTRAVENOUS
  Administered 2022-05-18 (×2): 80 ug via INTRAVENOUS

## 2022-05-18 MED ORDER — ONDANSETRON HCL 4 MG/2ML IJ SOLN
INTRAMUSCULAR | Status: DC | PRN
  Administered 2022-05-18: 4 mg via INTRAVENOUS

## 2022-05-18 MED ORDER — LIDOCAINE 2% (20 MG/ML) 5 ML SYRINGE
INTRAMUSCULAR | Status: DC | PRN
  Administered 2022-05-18: 40 mg via INTRAVENOUS

## 2022-05-18 MED ORDER — MIDAZOLAM HCL 2 MG/2ML IJ SOLN
INTRAMUSCULAR | Status: DC | PRN
  Administered 2022-05-18: 1 mg via INTRAVENOUS

## 2022-05-18 MED ORDER — DEXAMETHASONE SODIUM PHOSPHATE 10 MG/ML IJ SOLN
INTRAMUSCULAR | Status: DC | PRN
  Administered 2022-05-18: 10 mg via INTRAVENOUS

## 2022-05-18 MED ORDER — HYDROMORPHONE HCL 1 MG/ML IJ SOLN
2.0000 mg | Freq: Once | INTRAMUSCULAR | Status: AC
  Administered 2022-05-18: 1.5 mg via INTRAVENOUS

## 2022-05-18 MED ORDER — HEPARIN (PORCINE) IN NACL 1000-0.9 UT/500ML-% IV SOLN
INTRAVENOUS | Status: DC | PRN
  Administered 2022-05-18 (×2): 500 mL

## 2022-05-18 MED ORDER — ROCURONIUM BROMIDE 10 MG/ML (PF) SYRINGE
PREFILLED_SYRINGE | INTRAVENOUS | Status: DC | PRN
  Administered 2022-05-18: 60 mg via INTRAVENOUS

## 2022-05-18 MED ORDER — PHENYLEPHRINE HCL-NACL 20-0.9 MG/250ML-% IV SOLN
INTRAVENOUS | Status: DC | PRN
  Administered 2022-05-18: 40 ug/min via INTRAVENOUS

## 2022-05-18 MED ORDER — SODIUM CHLORIDE 0.9 % IV SOLN: INTRAVENOUS | Status: DC

## 2022-05-18 MED ORDER — SODIUM CHLORIDE 0.9% FLUSH: 3.0000 mL | Freq: Two times a day (BID) | INTRAVENOUS | Status: DC

## 2022-05-18 MED ORDER — HYDROMORPHONE HCL 1 MG/ML IJ SOLN
INTRAMUSCULAR | Status: AC
  Filled 2022-05-18: qty 1

## 2022-05-18 MED ORDER — ISOPROTERENOL HCL 0.2 MG/ML IJ SOLN
INTRAVENOUS | Status: DC | PRN
  Administered 2022-05-18: 4 ug/min via INTRAVENOUS

## 2022-05-18 MED ORDER — FENTANYL CITRATE (PF) 100 MCG/2ML IJ SOLN
INTRAMUSCULAR | Status: DC | PRN
  Administered 2022-05-18: 50 ug via INTRAVENOUS

## 2022-05-18 MED ORDER — HEPARIN SODIUM (PORCINE) 1000 UNIT/ML IJ SOLN
INTRAMUSCULAR | Status: AC
  Filled 2022-05-18: qty 10

## 2022-05-18 MED ORDER — METOPROLOL SUCCINATE ER 50 MG PO TB24
50.0000 mg | ORAL_TABLET | Freq: Every day | ORAL | Status: DC
  Administered 2022-05-18: 50 mg via ORAL
  Filled 2022-05-18: qty 1

## 2022-05-18 MED ORDER — PROPOFOL 500 MG/50ML IV EMUL
INTRAVENOUS | Status: DC | PRN
  Administered 2022-05-18: 25 ug/kg/min via INTRAVENOUS

## 2022-05-18 MED ORDER — ONDANSETRON HCL 4 MG/2ML IJ SOLN: 4.0000 mg | Freq: Four times a day (QID) | INTRAMUSCULAR | Status: DC | PRN

## 2022-05-18 MED ORDER — FLECAINIDE ACETATE 50 MG PO TABS
50.0000 mg | ORAL_TABLET | Freq: Two times a day (BID) | ORAL | 3 refills | Status: DC
  Filled 2022-05-18: qty 60, 30d supply, fill #0

## 2022-05-18 MED ORDER — PROPOFOL 10 MG/ML IV BOLUS
INTRAVENOUS | Status: DC | PRN
  Administered 2022-05-18: 130 mg via INTRAVENOUS

## 2022-05-18 MED ORDER — ISOPROTERENOL HCL 0.2 MG/ML IJ SOLN
INTRAMUSCULAR | Status: AC
  Filled 2022-05-18: qty 5

## 2022-05-18 MED ORDER — SODIUM CHLORIDE 0.9 % IV SOLN: 250.0000 mL | INTRAVENOUS | Status: DC | PRN

## 2022-05-18 MED ORDER — HEPARIN (PORCINE) IN NACL 1000-0.9 UT/500ML-% IV SOLN
INTRAVENOUS | Status: AC
  Filled 2022-05-18: qty 1000

## 2022-05-18 MED ORDER — SODIUM CHLORIDE 0.9% FLUSH: 3.0000 mL | INTRAVENOUS | Status: DC | PRN

## 2022-05-18 MED ORDER — ACETAMINOPHEN 325 MG PO TABS: 650.0000 mg | ORAL_TABLET | ORAL | Status: DC | PRN

## 2022-05-18 MED ORDER — APIXABAN 5 MG PO TABS
5.0000 mg | ORAL_TABLET | Freq: Two times a day (BID) | ORAL | Status: DC
  Administered 2022-05-18: 5 mg via ORAL
  Filled 2022-05-18: qty 1

## 2022-05-18 MED ORDER — FLECAINIDE ACETATE 50 MG PO TABS
50.0000 mg | ORAL_TABLET | Freq: Two times a day (BID) | ORAL | Status: DC
  Administered 2022-05-18: 50 mg via ORAL
  Filled 2022-05-18: qty 1

## 2022-05-18 MED ORDER — HYDROMORPHONE HCL 1 MG/ML IJ SOLN
INTRAMUSCULAR | Status: AC
  Filled 2022-05-18: qty 0.5

## 2022-05-18 MED ORDER — SUGAMMADEX SODIUM 200 MG/2ML IV SOLN
INTRAVENOUS | Status: DC | PRN
  Administered 2022-05-18: 200 mg via INTRAVENOUS

## 2022-05-18 SURGICAL SUPPLY — 13 items
CATH CRD2 QUAD 6FR 5 (CATHETERS) IMPLANT
CATH CRD2 QUAD 6FR REP (CATHETERS) IMPLANT
CATH JOSEPH QUAD ALLRED 6F REP (CATHETERS) IMPLANT
CATH WEB BI DIR CSDF CRV REPRO (CATHETERS) IMPLANT
CLOSURE PERCLOSE PROSTYLE (VASCULAR PRODUCTS) IMPLANT
PACK EP LATEX FREE (CUSTOM PROCEDURE TRAY) ×1
PACK EP LF (CUSTOM PROCEDURE TRAY) ×2 IMPLANT
PAD DEFIB RADIO PHYSIO CONN (PAD) ×2 IMPLANT
PATCH CARTO3 (PAD) IMPLANT
SHEATH PINNACLE 7F 10CM (SHEATH) IMPLANT
SHEATH PINNACLE 8F 10CM (SHEATH) IMPLANT
SHEATH PROBE COVER 6X72 (BAG) IMPLANT
TUBING SMART ABLATE COOLFLOW (TUBING) IMPLANT

## 2022-05-18 NOTE — Discharge Instructions (Addendum)
Post procedure care instructions No driving for 4 days. No lifting over 5 lbs for 1 week. No vigorous or sexual activity for 1 week. You may return to work/your usual activities on 05/26/22. Keep procedure site clean & dry. If you notice increased pain, swelling, bleeding or pus, call/return!  You may shower after 24 hours, but no soaking in baths/hot tubs/pools for 1 week.   Cardiac Ablation, Care After  This sheet gives you information about how to care for yourself after your procedure. Your health care provider may also give you more specific instructions. If you have problems or questions, contact your health care provider. What can I expect after the procedure? After the procedure, it is common to have: Bruising around your puncture site. Tenderness around your puncture site. Skipped heartbeats. Tiredness (fatigue).  Follow these instructions at home: Puncture site care  Follow instructions from your health care provider about how to take care of your puncture site. Make sure you: If present, leave stitches (sutures), skin glue, or adhesive strips in place. These skin closures may need to stay in place for up to 2 weeks. If adhesive strip edges start to loosen and curl up, you may trim the loose edges. Do not remove adhesive strips completely unless your health care provider tells you to do that. If a large square bandage is present, this may be removed 24 hours after surgery.  Check your puncture site every day for signs of infection. Check for: Redness, swelling, or pain. Fluid or blood. If your puncture site starts to bleed, lie down on your back, apply firm pressure to the area, and contact your health care provider. Warmth. Pus or a bad smell. A pea or small marble sized lump at the site is normal and can take up to three months to resolve.  Driving Do not drive for at least 4 days after your procedure or however long your health care provider recommends. (Do not resume driving if  you have previously been instructed not to drive for other health reasons.) Do not drive or use heavy machinery while taking prescription pain medicine. Activity Avoid activities that take a lot of effort for at least 7 days after your procedure. Do not lift anything that is heavier than 5 lb (4.5 kg) for one week.  No sexual activity for 1 week.  Return to your normal activities as told by your health care provider. Ask your health care provider what activities are safe for you. General instructions Take over-the-counter and prescription medicines only as told by your health care provider. Do not use any products that contain nicotine or tobacco, such as cigarettes and e-cigarettes. If you need help quitting, ask your health care provider. You may shower after 24 hours, but Do not take baths, swim, or use a hot tub for 1 week.  Do not drink alcohol for 24 hours after your procedure. Keep all follow-up visits as told by your health care provider. This is important. Contact a health care provider if: You have redness, mild swelling, or pain around your puncture site. You have fluid or blood coming from your puncture site that stops after applying firm pressure to the area. Your puncture site feels warm to the touch. You have pus or a bad smell coming from your puncture site. You have a fever. You have chest pain or discomfort that spreads to your neck, jaw, or arm. You are sweating a lot. You feel nauseous. You have a fast or irregular heartbeat. You  have shortness of breath. You are dizzy or light-headed and feel the need to lie down. You have pain or numbness in the arm or leg closest to your puncture site. Get help right away if: Your puncture site suddenly swells. Your puncture site is bleeding and the bleeding does not stop after applying firm pressure to the area. These symptoms may represent a serious problem that is an emergency. Do not wait to see if the symptoms will go away.  Get medical help right away. Call your local emergency services (911 in the [place] Do not drive yourself to the hospital. Summary After the procedure, it is normal to have bruising and tenderness at the puncture site in your groin, neck, or forearm. Check your puncture site every day for signs of infection. Get help right away if your puncture site is bleeding and the bleeding does not stop after applying firm pressure to the area. This is a medical emergency. This information is not intended to replace advice given to you by your health care provider. Make sure you discuss any questions you have with your health care provider.

## 2022-05-18 NOTE — Anesthesia Preprocedure Evaluation (Addendum)
Anesthesia Evaluation  Patient identified by MRN, date of birth, ID band Patient awake    Reviewed: Allergy & Precautions, NPO status , Patient's Chart, lab work & pertinent test results  History of Anesthesia Complications (+) PONV and history of anesthetic complications  Airway Mallampati: I  TM Distance: >3 FB Neck ROM: Limited    Dental  (+) Teeth Intact   Pulmonary sleep apnea ,    Pulmonary exam normal        Cardiovascular negative cardio ROS   Rhythm:Regular Rate:Normal  Echo: 1. Left ventricular ejection fraction, by estimation, is 55 to 60%. The  left ventricle has normal function. The left ventricle has no regional  wall motion abnormalities. Left ventricular diastolic parameters were  normal.  2. Right ventricular systolic function is normal. The right ventricular  size is normal. Tricuspid regurgitation signal is inadequate for assessing  PA pressure.  3. The mitral valve is normal in structure. Trivial mitral valve  regurgitation. No evidence of mitral stenosis.  4. The aortic valve is tricuspid. Aortic valve regurgitation is not  visualized. No aortic stenosis is present.  5. The inferior vena cava is normal in size with <50% respiratory  variability, suggesting right atrial pressure of 8 mmHg.    Neuro/Psych  Headaches, PSYCHIATRIC DISORDERS Depression    GI/Hepatic negative GI ROS, Neg liver ROS,   Endo/Other  negative endocrine ROS  Renal/GU negative Renal ROS     Musculoskeletal  (+) Arthritis ,   Abdominal   Peds  Hematology negative hematology ROS (+)   Anesthesia Other Findings   Reproductive/Obstetrics                            Anesthesia Physical Anesthesia Plan  ASA: 3  Anesthesia Plan: General   Post-op Pain Management:    Induction: Intravenous  PONV Risk Score and Plan: 3 and Ondansetron, Dexamethasone and Midazolam  Airway Management  Planned: Oral ETT  Additional Equipment: None  Intra-op Plan:   Post-operative Plan: Extubation in OR  Informed Consent: I have reviewed the patients History and Physical, chart, labs and discussed the procedure including the risks, benefits and alternatives for the proposed anesthesia with the patient or authorized representative who has indicated his/her understanding and acceptance.     Dental advisory given  Plan Discussed with: CRNA  Anesthesia Plan Comments:        Anesthesia Quick Evaluation

## 2022-05-18 NOTE — Interval H&P Note (Signed)
History and Physical Interval Note:  05/18/2022 12:05 PM  Melody Huerta  has presented today for surgery, with the diagnosis of svt.  The various methods of treatment have been discussed with the patient and family. After consideration of risks, benefits and other options for treatment, the patient has consented to  Procedure(s): SVT ABLATION (N/A) as a surgical intervention.  The patient's history has been reviewed, patient examined, no change in status, stable for surgery.  I have reviewed the patient's chart and labs.  Questions were answered to the patient's satisfaction.    Plan for EP study + possible SVT ablation + possible AF ablation.  Therapeutic strategies for supraventricular tachycardia including medicine and ablation were discussed in detail with the patient today. Risk, benefits, and alternatives to EP study and radiofrequency ablation were also discussed in detail today. These risks include but are not limited to stroke, bleeding, vascular damage, tamponade, perforation, damage to the heart and other structures, AV block requiring pacemaker, worsening renal function, and death. The patient understands these risk and wishes to proceed.    ------------------------  Discussed treatment options today for his AF including antiarrhythmic drug therapy and ablation. Discussed risks, recovery and likelihood of success. Discussed potential need for repeat ablation procedures and antiarrhythmic drugs after an initial ablation. They wish to proceed with scheduling.  Risk, benefits, and alternatives to EP study and radiofrequency ablation for afib were also discussed in detail today. These risks include but are not limited to stroke, bleeding, vascular damage, tamponade, perforation, damage to the esophagus, lungs, and other structures, pulmonary vein stenosis, worsening renal function, and death. The patient understands these risk and wishes to proceed.  We will therefore proceed with catheter  ablation at the next available time.  Carto, ICE, anesthesia are requested for the procedure.    Melody Huerta

## 2022-05-18 NOTE — Anesthesia Procedure Notes (Signed)
Procedure Name: Intubation Date/Time: 05/18/2022 1:06 PM  Performed by: Lorie Phenix, CRNAPre-anesthesia Checklist: Patient identified, Emergency Drugs available, Suction available and Patient being monitored Patient Re-evaluated:Patient Re-evaluated prior to induction Oxygen Delivery Method: Circle system utilized Preoxygenation: Pre-oxygenation with 100% oxygen Induction Type: IV induction Ventilation: Mask ventilation without difficulty Laryngoscope Size: Mac and 3 Grade View: Grade II Tube type: Oral Tube size: 7.0 mm Number of attempts: 1 Airway Equipment and Method: Stylet Placement Confirmation: ETT inserted through vocal cords under direct vision, positive ETCO2 and breath sounds checked- equal and bilateral Secured at: 21 cm Tube secured with: Tape Dental Injury: Teeth and Oropharynx as per pre-operative assessment

## 2022-05-20 DIAGNOSIS — I471 Supraventricular tachycardia, unspecified: Secondary | ICD-10-CM | POA: Diagnosis not present

## 2022-05-21 ENCOUNTER — Encounter (HOSPITAL_COMMUNITY): Payer: Self-pay | Admitting: Cardiology

## 2022-05-21 ENCOUNTER — Encounter: Payer: Self-pay | Admitting: Cardiology

## 2022-05-21 NOTE — Transfer of Care (Signed)
Immediate Anesthesia Transfer of Care Note  Patient: Melody Huerta  Procedure(s) Performed: SVT ABLATION  Patient Location: PACU  Anesthesia Type:General  Level of Consciousness: awake and alert   Airway & Oxygen Therapy: Patient Spontanous Breathing  Post-op Assessment: Report given to RN and Post -op Vital signs reviewed and stable  Post vital signs: Reviewed and stable  Last Vitals:  Vitals Value Taken Time  BP 152/93 05/18/22 1756  Temp 36.8 C 05/18/22 1445  Pulse 103 05/18/22 1756  Resp 23 05/18/22 1746  SpO2 90 % 05/18/22 1756  Vitals shown include unvalidated device data.  Last Pain:  Vitals:   05/18/22 1555  TempSrc:   PainSc: 4          Complications: There were no known notable events for this encounter.

## 2022-05-23 ENCOUNTER — Encounter: Payer: Self-pay | Admitting: Cardiology

## 2022-05-23 DIAGNOSIS — M47812 Spondylosis without myelopathy or radiculopathy, cervical region: Secondary | ICD-10-CM | POA: Diagnosis not present

## 2022-05-23 DIAGNOSIS — Z79891 Long term (current) use of opiate analgesic: Secondary | ICD-10-CM | POA: Diagnosis not present

## 2022-05-23 DIAGNOSIS — G4733 Obstructive sleep apnea (adult) (pediatric): Secondary | ICD-10-CM | POA: Diagnosis not present

## 2022-05-23 DIAGNOSIS — M961 Postlaminectomy syndrome, not elsewhere classified: Secondary | ICD-10-CM | POA: Diagnosis not present

## 2022-05-23 DIAGNOSIS — G894 Chronic pain syndrome: Secondary | ICD-10-CM | POA: Diagnosis not present

## 2022-05-23 MED FILL — Heparin Sodium (Porcine) Inj 1000 Unit/ML: INTRAMUSCULAR | Qty: 10 | Status: AC

## 2022-05-25 ENCOUNTER — Ambulatory Visit: Payer: Medicare Other | Attending: Cardiology

## 2022-05-25 VITALS — BP 139/76 | HR 70 | Wt 188.0 lb

## 2022-05-25 DIAGNOSIS — I471 Supraventricular tachycardia, unspecified: Secondary | ICD-10-CM | POA: Diagnosis not present

## 2022-05-25 DIAGNOSIS — G4733 Obstructive sleep apnea (adult) (pediatric): Secondary | ICD-10-CM | POA: Diagnosis not present

## 2022-05-25 DIAGNOSIS — I4891 Unspecified atrial fibrillation: Secondary | ICD-10-CM | POA: Diagnosis not present

## 2022-05-25 NOTE — Progress Notes (Signed)
   Nurse Visit   Date of Encounter: 05/25/2022 ID: Melody Huerta, DOB [birth year], MRN [id number]  PCP:  Orpah Melter, MD   [facility] Cardiologist:  Fransico Him, MD Electrophysiologist:  Vickie Epley, MD      Visit Details   VS:  BP 139/76   Pulse 70   Wt 188 lb (85.3 kg)   BMI 28.17 kg/m  , BMI Body mass index is 28.17 kg/m.  Wt Readings from Last 3 Encounters:  05/25/22 188 lb (85.3 kg)  05/18/22 180 lb (81.6 kg)  04/27/22 185 lb (83.9 kg)     Reason for visit: EKG for Flecainide start Performed today: Vital signs, EKG and consult with Dr Lynne Leader Changes (medications, testing, etc.) : No changes Length of Visit: 30 minutes    Medications Adjustments/Labs and Tests Ordered: No orders of the defined types were placed in this encounter.  No orders of the defined types were placed in this encounter.  Pt presents today for Flecainide start last week.  Pt reports she is tolerating medication well with exception of some mild occasional dizziness.  She is not certain this is related to the Flecainide but will continue to monitor for side effects. Per Dr Johney Frame pt should continue medications as prescribed and follow up as scheduled.  Signed, Thora Lance, RN  05/25/2022 12:17 PM

## 2022-05-25 NOTE — Patient Instructions (Signed)
Medication Instructions:  Your physician recommends that you continue on your current medications as directed. Please refer to the Current Medication list given to you today.  *If you need a refill on your cardiac medications before your next appointment, please call your pharmacy*   Lab Work: None ordered.  If you have labs (blood work) drawn today and your tests are completely normal, you will receive your results only by: [place] (if you have MyChart) OR A paper copy in the mail If you have any lab test that is abnormal or we need to change your treatment, we will call you to review the results.   Testing/Procedures: None ordered.    Follow-Up: At [facility], you and your health needs are our priority.  As part of our continuing mission to provide you with exceptional heart care, we have created designated Provider Care Teams.  These Care Teams include your primary Cardiologist (physician) and Advanced Practice Providers (APPs -  Physician Assistants and Nurse Practitioners) who all work together to provide you with the care you need, when you need it.  We recommend signing up for the patient portal called "MyChart".  Sign up information is provided on this After Visit Summary.  MyChart is used to connect with patients for Virtual Visits (Telemedicine).  Patients are [place] to view lab/test results, encounter notes, upcoming appointments, etc.  Non-urgent messages can be sent to your provider as well.   To learn more about what you can do with MyChart, go to [email or web address].    Your next appointment:   Keep follow up as scheduled  Important Information About Sugar

## 2022-05-28 NOTE — Anesthesia Postprocedure Evaluation (Signed)
Anesthesia Post Note  Patient: Melody Huerta  Procedure(s) Performed: SVT ABLATION     Patient location during evaluation: PACU Anesthesia Type: General Level of consciousness: awake and alert Pain management: pain level controlled Vital Signs Assessment: post-procedure vital signs reviewed and stable Respiratory status: spontaneous breathing, nonlabored ventilation, respiratory function stable and patient connected to nasal cannula oxygen Cardiovascular status: blood pressure returned to baseline and stable Postop Assessment: no apparent nausea or vomiting Anesthetic complications: no   There were no known notable events for this encounter.  Last Vitals:  Vitals:   05/18/22 1730 05/18/22 1755  BP: (!) 152/77 (!) 152/93  Pulse: (!) 101 (!) 103  Resp: 14   Temp:    SpO2: 92% 90%    Last Pain:  Vitals:   05/18/22 1555  TempSrc:   PainSc: [place] [name]

## 2022-05-30 ENCOUNTER — Other Ambulatory Visit: Payer: Self-pay | Admitting: *Deleted

## 2022-05-30 DIAGNOSIS — Z5181 Encounter for therapeutic drug level monitoring: Secondary | ICD-10-CM

## 2022-06-01 ENCOUNTER — Telehealth: Payer: Self-pay | Admitting: *Deleted

## 2022-06-01 NOTE — Telephone Encounter (Signed)
Flecainide KEG is reviewed and intervals look ok/stable.   Called patient to f/u on her reports of dizziness with the medication. She is not feeling lightheaded, nothing along the lines of near syncope, no fainting. She mentions an hour or 2 after taking the flecainide she feels a bit jittery perhaps.  She has not felt an palpitations or arrhythmias.  She will continue and let us know if any escalation, or other symptoms arise She has follow up with Dr. Quentin Ore in place Pending that visit plan for EST if felt needed.  Tommye Standard, PA-C

## 2022-06-01 NOTE — Telephone Encounter (Signed)
Spoke with patient about recommendation on stress test for monitoring Flecainide. Patient was unagreeable  to recommendations due to medication making her feel dizzy and agitated.Patient feels like the medication not working and doesn't want to continue with medication , but is currently taking it. Patient was told they would be contacted back with any new recommendations.

## 2022-06-12 ENCOUNTER — Encounter: Payer: Self-pay | Admitting: Cardiology

## 2022-06-12 MED ORDER — FLECAINIDE ACETATE 50 MG PO TABS: 50.0000 mg | ORAL_TABLET | Freq: Two times a day (BID) | ORAL | 3 refills | Status: DC

## 2022-06-19 ENCOUNTER — Encounter: Payer: Self-pay | Admitting: Cardiology

## 2022-06-19 ENCOUNTER — Ambulatory Visit: Payer: Medicare Other | Attending: Cardiology | Admitting: Cardiology

## 2022-06-19 VITALS — BP 142/86 | HR 70 | Ht 68.5 in | Wt 188.4 lb

## 2022-06-19 DIAGNOSIS — Z79899 Other long term (current) drug therapy: Secondary | ICD-10-CM | POA: Diagnosis not present

## 2022-06-19 DIAGNOSIS — I4891 Unspecified atrial fibrillation: Secondary | ICD-10-CM | POA: Diagnosis not present

## 2022-06-19 DIAGNOSIS — I471 Supraventricular tachycardia, unspecified: Secondary | ICD-10-CM | POA: Diagnosis not present

## 2022-06-19 NOTE — Progress Notes (Signed)
Electrophysiology Office Follow up Visit Note:    Date:  06/19/2022   ID:  Melody Huerta, DOB [birth year], MRN [id number]  PCP:  Orpah Melter, MD  [place] Cardiologist:  Fransico Him, MD  [facility] Electrophysiologist:  Vickie Epley, MD    Interval History:    Melody Huerta is a 71 y.o. female who presents for a follow up visit. They were last seen in clinic 04/27/2022. At that time we planned for EP study and ablation.  She underwent SVT ablation on 05/18/2022. Was in sinus rhythm on presentation; nonsustained atrial tachycardia was observed during EP study. She was started on flecainide 50 mg BID with plans for loop recorder implant as outpatient.  Today, she reports experiencing dizziness as a side effect of flecainide. She is also feeling "jittery" for 30 minutes after each dose of her flecainide.  No recurring episodes of arrhythmia. She does not wish to proceed with the ILR implant today.  She denies any palpitations, chest pain, shortness of breath, or peripheral edema. No headaches, syncope, orthopnea, or PND.     Past Medical History:  Diagnosis Date   Arthritis    Chronic pain disorder    Complication of anesthesia    WITH LAST CERVICAL SURGERY PT VERY SICK N+V   DDD (degenerative disc disease), cervical    CERV+ LUMBAR   Depression    Gallstones    HLD (hyperlipidemia)    pt denies   Migraines    PONV (postoperative nausea and vomiting)    Sleep apnea    on CPAP  NOT [place] TO USE  (USING MOSES MOUTH PIECE)   UTI (lower urinary tract infection)    HX  UTI      Past Surgical History:  Procedure Laterality Date   2nd lumbar fusion  2001   [place]     2013   FUSION   CHOLECYSTECTOMY     L4,L5,S1,S2 fusion  1998   left shoulder arthroscopy Left 09/08/08   Donnald Garre MD, frozen shoulder   NASAL SINUS SURGERY     1980S   PERIFORMIS     2001 OR 02   POSTERIOR CERVICAL FUSION/FORAMINOTOMY N/A  10/03/2015   Procedure: POSTERIOR CERVICAL FUSION/FORAMINOTOMY CERVICAL SIX-SEVEN WITH LATERAL MASS FIXATION;  Surgeon: Newman Pies, MD;  Location: [place] [facility];  Service: Neurosurgery;  Laterality: N/A;   ROTATOR CUFF REPAIR Right 2002   S1 fusion  2000 and 2001   SACROILIAC JOINT FUSION     SVT ABLATION N/A 05/18/2022   Procedure: SVT ABLATION;  Surgeon: Vickie Epley, MD;  Location: [place] [facility];  Service: Cardiovascular;  Laterality: N/A;   TONSILLECTOMY      Current Medications: Current Meds  Medication Sig   Alpha-Lipoic Acid 300 MG CAPS Take 600 mg by mouth daily.   apixaban (ELIQUIS) 5 MG TABS tablet Take 5 mg by mouth 2 (two) times daily.   baclofen (LIORESAL) 20 MG tablet Take 20 mg by mouth every 8 (eight) hours.   busPIRone (BUSPAR) 15 MG tablet Take 15 mg by mouth at bedtime.   flecainide (TAMBOCOR) 50 MG tablet Take 1 tablet (50 mg total) by mouth 2 (two) times daily.   fluticasone (FLONASE) 50 MCG/ACT nasal spray Place 2 sprays into both nostrils at bedtime.   HYDROmorphone (DILAUDID) 4 MG tablet Take 4 mg by mouth in the morning, at noon, in the evening, and at bedtime.   levocetirizine (  XYZAL) 5 MG tablet Take 5 mg by mouth daily.   metoprolol succinate (TOPROL-XL) 50 MG 24 hr tablet Take 50 mg by mouth daily. Take with or immediately following a meal.   MOVANTIK 12.5 MG TABS tablet Take 12.5 mg by mouth daily.   Polyethyl Glycol-Propyl Glycol (SYSTANE OP) Apply 2 drops to eye in the morning, at noon, and at bedtime.   tamsulosin (FLOMAX) 0.4 MG CAPS capsule Take 0.4 mg by mouth daily.   tapentadol (NUCYNTA) 50 MG tablet Take 50 mg by mouth every 12 (twelve) hours.   traZODone (DESYREL) 150 MG tablet Take 150 mg by mouth at bedtime.   XTAMPZA ER 36 MG C12A Take 1 capsule by mouth every 8 (eight) hours.     Allergies:   Botox [botulinum toxin type a], Bactrim [sulfamethoxazole-trimethoprim], and Penicillins   Social History   Socioeconomic History    Marital status: Married    Spouse name: Not on file   Number of children: 2   Years of education: Not on file   Highest education level: Not on file  Occupational History   Not on file  Tobacco Use   Smoking status: Never   Smokeless tobacco: Never   Tobacco comments:    tobacco use - no  Substance and Sexual Activity   Alcohol use: No   Drug use: No   Sexual activity: Not on file  Other Topics Concern   Not on file  Social History Narrative   Married, disabled 76    Social Determinants of Health   Financial Resource Strain: Not on file  Food Insecurity: Not on file  Transportation Needs: Not on file  Physical Activity: Not on file  Stress: Not on file  Social Connections: Not on file     Family History: The patient's family history includes Alzheimer's disease in her mother; COPD in her father; Diabetes in her maternal grandfather, paternal grandfather, and paternal grandmother; Heart disease in her father; Migraines in her mother; Stroke in her father. There is no history of Colon cancer.  ROS:   Please see the history of present illness.    (+) Dizziness (+) "Jittery" sensations All other systems reviewed and are negative.  EKGs/Labs/Other Studies Reviewed:    The following studies were reviewed today:  Echo  03/01/2022:  1. Left ventricular ejection fraction, by estimation, is 55 to 60%. The  left ventricle has normal function. The left ventricle has no regional  wall motion abnormalities. Left ventricular diastolic parameters were  normal.   2. Right ventricular systolic function is normal. The right ventricular  size is normal. Tricuspid regurgitation signal is inadequate for assessing  PA pressure.   3. The mitral valve is normal in structure. Trivial mitral valve  regurgitation. No evidence of mitral stenosis.   4. The aortic valve is tricuspid. Aortic valve regurgitation is not  visualized. No aortic stenosis is present.   5. The inferior vena cava is  normal in size with <50% respiratory  variability, suggesting right atrial pressure of 8 mmHg.   EKG:  EKG is personally reviewed.  06/19/2022:  sinus rhythm, incomplete LBBB  Recent Labs: 02/28/2022: Magnesium 2.3; TSH 0.693 05/02/2022: BUN 15; Creatinine, Ser 1.04; Hemoglobin 14.4; Platelets 237; Potassium 4.0; Sodium 141   Recent Lipid Panel    Component Value Date/Time   CHOL 111 04/21/2008 1410   TRIG 38 04/21/2008 1410   HDL 83.7 04/21/2008 1410   CHOLHDL 1.3 CALC 04/21/2008 1410   VLDL 8 04/21/2008 1410  LDLCALC 20 04/21/2008 1410    Physical Exam:    VS:  BP (!) 142/86   Pulse 70   Ht 5' 8.5" (1.74 m)   Wt 188 lb 6.4 oz (85.5 kg)   SpO2 95%   BMI 28.23 kg/m     Wt Readings from Last 3 Encounters:  06/19/22 188 lb 6.4 oz (85.5 kg)  05/25/22 188 lb (85.3 kg)  05/18/22 180 lb (81.6 kg)     GEN: Well nourished, well developed in no acute distress HEENT: Normal NECK: No JVD; No carotid bruits LYMPHATICS: No lymphadenopathy CARDIAC: RRR, no murmurs, rubs, gallops RESPIRATORY:  Clear to auscultation without rales, wheezing or rhonchi  ABDOMEN: Soft, non-tender, non-distended MUSCULOSKELETAL:  No edema; No deformity  SKIN: Warm and dry NEUROLOGIC:  Alert and oriented x 3 PSYCHIATRIC:  Normal affect        ASSESSMENT:    1. SVT (supraventricular tachycardia)   2. Encounter for long-term (current) use of high-risk medication   3. Atrial fibrillation with RVR (HCC)    PLAN:    In order of problems listed above:  #SVT #A-fib No inducible arrhythmias during EP study.  Stop flecainide given dizziness after each dose. Continue metoprolol succinate for SVT/AF.  Follow up 1 year with APP or sooner PRN.     Medication Adjustments/Labs and Tests Ordered: Current medicines are reviewed at length with the patient today.  Concerns regarding medicines are outlined above.   No orders of the defined types were placed in this encounter.  No orders of the  defined types were placed in this encounter.  I,Mathew Stumpf,acting as a Neurosurgeon for Lanier Prude, MD.,have documented all relevant documentation on the behalf of Lanier Prude, MD,as directed by  Lanier Prude, MD while in the presence of Lanier Prude, MD.  I, Lanier Prude, MD, have reviewed all documentation for this visit. The documentation on 06/19/22 for the exam, diagnosis, procedures, and orders are all accurate and complete.   Signed, Steffanie Dunn, MD, [facility], [facility] 06/19/2022 4:16 PM    Electrophysiology [facility]

## 2022-06-19 NOTE — Patient Instructions (Signed)
Medication Instructions:  Stop Flecainide  *If you need a refill on your cardiac medications before your next appointment, please call your pharmacy*   Lab Work: None  If you have labs (blood work) drawn today and your tests are completely normal, you will receive your results only by: MyChart Message (if you have MyChart) OR A paper copy in the mail If you have any lab test that is abnormal or we need to change your treatment, we will call you to review the results.   Testing/Procedures: None    Follow-Up: At [facility], you and your health needs are our priority.  As part of our continuing mission to provide you with exceptional heart care, we have created designated Provider Care Teams.  These Care Teams include your primary Cardiologist (physician) and Advanced Practice Providers (APPs -  Physician Assistants and Nurse Practitioners) who all work together to provide you with the care you need, when you need it.  We recommend signing up for the patient portal called "MyChart".  Sign up information is provided on this After Visit Summary.  MyChart is used to connect with patients for Virtual Visits (Telemedicine).  Patients are [place] to view lab/test results, encounter notes, upcoming appointments, etc.  Non-urgent messages can be sent to your provider as well.   To learn more about what you can do with MyChart, go to [email or web address].    Your next appointment:   1 year(s)  The format for your next appointment:   In Person  Provider:   You will see one of the following Advanced Practice Providers on your designated Care Team:   Francis Dowse, [place] Casimiro Needle "Mardelle Matte" Lanna Poche, [place]      Other Instructions None   Important Information About Sugar

## 2022-06-20 DIAGNOSIS — M961 Postlaminectomy syndrome, not elsewhere classified: Secondary | ICD-10-CM | POA: Diagnosis not present

## 2022-06-20 DIAGNOSIS — G894 Chronic pain syndrome: Secondary | ICD-10-CM | POA: Diagnosis not present

## 2022-06-20 DIAGNOSIS — M47812 Spondylosis without myelopathy or radiculopathy, cervical region: Secondary | ICD-10-CM | POA: Diagnosis not present

## 2022-06-20 DIAGNOSIS — Z79891 Long term (current) use of opiate analgesic: Secondary | ICD-10-CM | POA: Diagnosis not present

## 2022-06-23 DIAGNOSIS — G4733 Obstructive sleep apnea (adult) (pediatric): Secondary | ICD-10-CM | POA: Diagnosis not present

## 2022-06-26 ENCOUNTER — Other Ambulatory Visit (HOSPITAL_COMMUNITY): Payer: Self-pay

## 2022-07-19 DIAGNOSIS — M47812 Spondylosis without myelopathy or radiculopathy, cervical region: Secondary | ICD-10-CM | POA: Diagnosis not present

## 2022-07-19 DIAGNOSIS — Z79891 Long term (current) use of opiate analgesic: Secondary | ICD-10-CM | POA: Diagnosis not present

## 2022-07-19 DIAGNOSIS — M961 Postlaminectomy syndrome, not elsewhere classified: Secondary | ICD-10-CM | POA: Diagnosis not present

## 2022-07-19 DIAGNOSIS — G894 Chronic pain syndrome: Secondary | ICD-10-CM | POA: Diagnosis not present

## 2022-07-23 DIAGNOSIS — G4733 Obstructive sleep apnea (adult) (pediatric): Secondary | ICD-10-CM | POA: Diagnosis not present

## 2022-07-26 ENCOUNTER — Other Ambulatory Visit: Payer: Self-pay

## 2022-07-26 DIAGNOSIS — Z79899 Other long term (current) drug therapy: Secondary | ICD-10-CM

## 2022-07-26 MED ORDER — APIXABAN 5 MG PO TABS: 5.0000 mg | ORAL_TABLET | Freq: Two times a day (BID) | ORAL | 1 refills | Status: DC

## 2022-07-26 MED ORDER — METOPROLOL SUCCINATE ER 50 MG PO TB24: 50.0000 mg | ORAL_TABLET | Freq: Every day | ORAL | 3 refills | Status: DC

## 2022-07-26 NOTE — Telephone Encounter (Signed)
Pt's medication was sent to pt's pharmacy as requested. Confirmation received.  °

## 2022-07-26 NOTE — Telephone Encounter (Signed)
Prescription refill request for Eliquis received. Indication: Afib  Last office visit: 06/19/22 Lalla Brothers)  Scr: 1.04 (05/02/22)  Age: 71 Weight: 85.5kg  Appropriate dose and refill sent to requested pharmacy.

## 2022-07-27 ENCOUNTER — Telehealth: Payer: Self-pay | Admitting: *Deleted

## 2022-07-27 ENCOUNTER — Encounter: Payer: Self-pay | Admitting: *Deleted

## 2022-07-27 NOTE — Patient Outreach (Signed)
  Care Coordination   Initial Visit Note   07/27/2022 Name: Melody Huerta MRN: [id number] DOB: [birth year]  Melody Huerta is a 71 y.o. year old female who sees Joycelyn Rua, MD for primary care. I spoke with  Lambert Keto Radabaugh by phone today.  What matters to the patients health and wellness today?  No needs    Goals Addressed             This Visit's Progress    COMPLETED: care coordination actvity       Care Coordination Interventions: Reviewed medications with patient and discussed adherence with all medications with no needed refills Reviewed scheduled/upcoming provider appointments including sufficient transportation source Screening for signs and symptoms of depression related to chronic disease state  Assessed social determinant of health barriers Educated on care management services as pt has indicated no needs today.         SDOH assessments and interventions completed:  Yes  SDOH Interventions Today    Flowsheet Row Most Recent Value  SDOH Interventions   Food Insecurity Interventions Intervention Not Indicated  Housing Interventions Intervention Not Indicated  Transportation Interventions Intervention Not Indicated  Utilities Interventions Intervention Not Indicated        Care Coordination Interventions:  Yes, provided   Follow up plan: No further intervention required.   Encounter Outcome:  Pt. Visit Completed   Elliot Cousin, RN Care Management Coordinator [facility] Main Office [phone]

## 2022-07-27 NOTE — Patient Instructions (Signed)
Visit Information  Thank you for taking time to visit with me today. Please don't hesitate to contact me if I can be of assistance to you.   Following are the goals we discussed today:   Goals Addressed             This Visit's Progress    COMPLETED: care coordination actvity       Care Coordination Interventions: Reviewed medications with patient and discussed adherence with all medications with no needed refills Reviewed scheduled/upcoming provider appointments including sufficient transportation source Screening for signs and symptoms of depression related to chronic disease state  Assessed social determinant of health barriers Educated on care management services as pt has indicated no needs today.         Please call the care guide team at [phone] if you need to cancel or reschedule your appointment.   If you are experiencing a Mental Health or Behavioral Health Crisis or need someone to talk to, please call the Suicide and Crisis Lifeline: 988  Patient verbalizes understanding of instructions and care plan provided today and agrees to view in MyChart. Active MyChart status and patient understanding of how to access instructions and care plan via MyChart confirmed with patient.     No further follow up required: No further follow up needs  Elliot Cousin, RN Care Management Coordinator [facility] Main Office [phone]

## 2022-08-15 ENCOUNTER — Other Ambulatory Visit: Payer: Self-pay

## 2022-08-15 ENCOUNTER — Encounter (HOSPITAL_BASED_OUTPATIENT_CLINIC_OR_DEPARTMENT_OTHER): Payer: Self-pay | Admitting: Pharmacist

## 2022-08-15 ENCOUNTER — Other Ambulatory Visit (HOSPITAL_BASED_OUTPATIENT_CLINIC_OR_DEPARTMENT_OTHER): Payer: Self-pay

## 2022-08-15 DIAGNOSIS — M961 Postlaminectomy syndrome, not elsewhere classified: Secondary | ICD-10-CM | POA: Diagnosis not present

## 2022-08-15 DIAGNOSIS — M47812 Spondylosis without myelopathy or radiculopathy, cervical region: Secondary | ICD-10-CM | POA: Diagnosis not present

## 2022-08-15 DIAGNOSIS — G894 Chronic pain syndrome: Secondary | ICD-10-CM | POA: Diagnosis not present

## 2022-08-15 DIAGNOSIS — Z79891 Long term (current) use of opiate analgesic: Secondary | ICD-10-CM | POA: Diagnosis not present

## 2022-08-15 MED ORDER — HYDROMORPHONE HCL 4 MG PO TABS
4.0000 mg | ORAL_TABLET | Freq: Four times a day (QID) | ORAL | 0 refills | Status: DC | PRN
  Filled 2022-08-15: qty 120, 30d supply, fill #0

## 2022-08-16 ENCOUNTER — Other Ambulatory Visit (HOSPITAL_BASED_OUTPATIENT_CLINIC_OR_DEPARTMENT_OTHER): Payer: Self-pay

## 2022-08-28 DIAGNOSIS — H02889 Meibomian gland dysfunction of unspecified eye, unspecified eyelid: Secondary | ICD-10-CM | POA: Diagnosis not present

## 2022-08-28 DIAGNOSIS — H04123 Dry eye syndrome of bilateral lacrimal glands: Secondary | ICD-10-CM | POA: Diagnosis not present

## 2022-08-28 DIAGNOSIS — H40053 Ocular hypertension, bilateral: Secondary | ICD-10-CM | POA: Diagnosis not present

## 2022-08-31 DIAGNOSIS — R350 Frequency of micturition: Secondary | ICD-10-CM | POA: Diagnosis not present

## 2022-08-31 DIAGNOSIS — R3914 Feeling of incomplete bladder emptying: Secondary | ICD-10-CM | POA: Diagnosis not present

## 2022-09-13 ENCOUNTER — Other Ambulatory Visit (HOSPITAL_BASED_OUTPATIENT_CLINIC_OR_DEPARTMENT_OTHER): Payer: Self-pay

## 2022-09-13 ENCOUNTER — Encounter (HOSPITAL_COMMUNITY): Payer: Self-pay | Admitting: *Deleted

## 2022-09-13 DIAGNOSIS — M47812 Spondylosis without myelopathy or radiculopathy, cervical region: Secondary | ICD-10-CM | POA: Diagnosis not present

## 2022-09-13 DIAGNOSIS — G894 Chronic pain syndrome: Secondary | ICD-10-CM | POA: Diagnosis not present

## 2022-09-13 DIAGNOSIS — Z79891 Long term (current) use of opiate analgesic: Secondary | ICD-10-CM | POA: Diagnosis not present

## 2022-09-13 DIAGNOSIS — M961 Postlaminectomy syndrome, not elsewhere classified: Secondary | ICD-10-CM | POA: Diagnosis not present

## 2022-09-13 MED ORDER — HYDROMORPHONE HCL 4 MG PO TABS
4.0000 mg | ORAL_TABLET | Freq: Four times a day (QID) | ORAL | 0 refills | Status: DC | PRN
  Filled 2022-09-13 – 2022-10-18 (×2): qty 120, 30d supply, fill #0

## 2022-09-13 MED ORDER — HYDROMORPHONE HCL 4 MG PO TABS
4.0000 mg | ORAL_TABLET | Freq: Four times a day (QID) | ORAL | 0 refills | Status: DC | PRN
  Filled 2022-09-13: qty 120, 30d supply, fill #0

## 2022-10-01 DIAGNOSIS — H04123 Dry eye syndrome of bilateral lacrimal glands: Secondary | ICD-10-CM | POA: Diagnosis not present

## 2022-10-01 DIAGNOSIS — H02889 Meibomian gland dysfunction of unspecified eye, unspecified eyelid: Secondary | ICD-10-CM | POA: Diagnosis not present

## 2022-10-08 DIAGNOSIS — G4733 Obstructive sleep apnea (adult) (pediatric): Secondary | ICD-10-CM | POA: Diagnosis not present

## 2022-10-18 ENCOUNTER — Other Ambulatory Visit (HOSPITAL_BASED_OUTPATIENT_CLINIC_OR_DEPARTMENT_OTHER): Payer: Self-pay

## 2022-10-18 DIAGNOSIS — Z79891 Long term (current) use of opiate analgesic: Secondary | ICD-10-CM | POA: Diagnosis not present

## 2022-10-18 DIAGNOSIS — G894 Chronic pain syndrome: Secondary | ICD-10-CM | POA: Diagnosis not present

## 2022-10-18 DIAGNOSIS — M47812 Spondylosis without myelopathy or radiculopathy, cervical region: Secondary | ICD-10-CM | POA: Diagnosis not present

## 2022-10-18 DIAGNOSIS — M961 Postlaminectomy syndrome, not elsewhere classified: Secondary | ICD-10-CM | POA: Diagnosis not present

## 2022-10-18 MED ORDER — HYDROMORPHONE HCL 4 MG PO TABS: 4.0000 mg | ORAL_TABLET | Freq: Four times a day (QID) | ORAL | 0 refills | Status: AC | PRN

## 2022-11-14 DIAGNOSIS — Z79891 Long term (current) use of opiate analgesic: Secondary | ICD-10-CM | POA: Diagnosis not present

## 2022-11-14 DIAGNOSIS — M961 Postlaminectomy syndrome, not elsewhere classified: Secondary | ICD-10-CM | POA: Diagnosis not present

## 2022-11-14 DIAGNOSIS — G894 Chronic pain syndrome: Secondary | ICD-10-CM | POA: Diagnosis not present

## 2022-11-14 DIAGNOSIS — M47812 Spondylosis without myelopathy or radiculopathy, cervical region: Secondary | ICD-10-CM | POA: Diagnosis not present

## 2022-11-16 DIAGNOSIS — G4733 Obstructive sleep apnea (adult) (pediatric): Secondary | ICD-10-CM | POA: Diagnosis not present

## 2022-12-03 DIAGNOSIS — H02889 Meibomian gland dysfunction of unspecified eye, unspecified eyelid: Secondary | ICD-10-CM | POA: Diagnosis not present

## 2022-12-03 DIAGNOSIS — H04123 Dry eye syndrome of bilateral lacrimal glands: Secondary | ICD-10-CM | POA: Diagnosis not present

## 2022-12-12 DIAGNOSIS — G894 Chronic pain syndrome: Secondary | ICD-10-CM | POA: Diagnosis not present

## 2022-12-12 DIAGNOSIS — Z79891 Long term (current) use of opiate analgesic: Secondary | ICD-10-CM | POA: Diagnosis not present

## 2022-12-12 DIAGNOSIS — M961 Postlaminectomy syndrome, not elsewhere classified: Secondary | ICD-10-CM | POA: Diagnosis not present

## 2022-12-12 DIAGNOSIS — M47812 Spondylosis without myelopathy or radiculopathy, cervical region: Secondary | ICD-10-CM | POA: Diagnosis not present

## 2022-12-16 DIAGNOSIS — G4733 Obstructive sleep apnea (adult) (pediatric): Secondary | ICD-10-CM | POA: Diagnosis not present

## 2022-12-17 DIAGNOSIS — L570 Actinic keratosis: Secondary | ICD-10-CM | POA: Diagnosis not present

## 2022-12-17 DIAGNOSIS — L820 Inflamed seborrheic keratosis: Secondary | ICD-10-CM | POA: Diagnosis not present

## 2022-12-17 DIAGNOSIS — D1801 Hemangioma of skin and subcutaneous tissue: Secondary | ICD-10-CM | POA: Diagnosis not present

## 2022-12-17 DIAGNOSIS — Z129 Encounter for screening for malignant neoplasm, site unspecified: Secondary | ICD-10-CM | POA: Diagnosis not present

## 2022-12-17 DIAGNOSIS — L821 Other seborrheic keratosis: Secondary | ICD-10-CM | POA: Diagnosis not present

## 2022-12-20 ENCOUNTER — Other Ambulatory Visit: Payer: Self-pay | Admitting: Family Medicine

## 2022-12-20 DIAGNOSIS — Z0000 Encounter for general adult medical examination without abnormal findings: Secondary | ICD-10-CM

## 2023-01-09 DIAGNOSIS — D6869 Other thrombophilia: Secondary | ICD-10-CM | POA: Diagnosis not present

## 2023-01-09 DIAGNOSIS — Z23 Encounter for immunization: Secondary | ICD-10-CM | POA: Diagnosis not present

## 2023-01-09 DIAGNOSIS — I471 Supraventricular tachycardia, unspecified: Secondary | ICD-10-CM | POA: Diagnosis not present

## 2023-01-09 DIAGNOSIS — E785 Hyperlipidemia, unspecified: Secondary | ICD-10-CM | POA: Diagnosis not present

## 2023-01-09 DIAGNOSIS — Z0000 Encounter for general adult medical examination without abnormal findings: Secondary | ICD-10-CM | POA: Diagnosis not present

## 2023-01-09 DIAGNOSIS — R7303 Prediabetes: Secondary | ICD-10-CM | POA: Diagnosis not present

## 2023-01-09 DIAGNOSIS — I4891 Unspecified atrial fibrillation: Secondary | ICD-10-CM | POA: Diagnosis not present

## 2023-01-09 DIAGNOSIS — I10 Essential (primary) hypertension: Secondary | ICD-10-CM | POA: Diagnosis not present

## 2023-01-15 ENCOUNTER — Other Ambulatory Visit: Payer: Self-pay | Admitting: Cardiology

## 2023-01-15 DIAGNOSIS — Z79899 Other long term (current) drug therapy: Secondary | ICD-10-CM

## 2023-01-15 NOTE — Telephone Encounter (Signed)
Prescription refill request for Eliquis received. Indication: Afib  Last office visit: 06/19/22 Lalla Brothers)  Scr: 1.04 (05/02/22)  Age: 72 Weight: 85.5kg  Appropriate dose. Refill sent.

## 2023-01-16 DIAGNOSIS — G4733 Obstructive sleep apnea (adult) (pediatric): Secondary | ICD-10-CM | POA: Diagnosis not present

## 2023-01-17 DIAGNOSIS — M961 Postlaminectomy syndrome, not elsewhere classified: Secondary | ICD-10-CM | POA: Diagnosis not present

## 2023-01-17 DIAGNOSIS — G894 Chronic pain syndrome: Secondary | ICD-10-CM | POA: Diagnosis not present

## 2023-01-17 DIAGNOSIS — M47812 Spondylosis without myelopathy or radiculopathy, cervical region: Secondary | ICD-10-CM | POA: Diagnosis not present

## 2023-01-17 DIAGNOSIS — Z79891 Long term (current) use of opiate analgesic: Secondary | ICD-10-CM | POA: Diagnosis not present

## 2023-01-31 ENCOUNTER — Ambulatory Visit
Admission: RE | Admit: 2023-01-31 | Discharge: 2023-01-31 | Disposition: A | Discharge status: Home / Self Care | Payer: Medicare Other | Source: Ambulatory Visit | Attending: Family Medicine | Admitting: Family Medicine

## 2023-01-31 DIAGNOSIS — Z0000 Encounter for general adult medical examination without abnormal findings: Secondary | ICD-10-CM

## 2023-01-31 DIAGNOSIS — Z1231 Encounter for screening mammogram for malignant neoplasm of breast: Secondary | ICD-10-CM | POA: Diagnosis not present

## 2023-02-15 DIAGNOSIS — G4733 Obstructive sleep apnea (adult) (pediatric): Secondary | ICD-10-CM | POA: Diagnosis not present

## 2023-02-19 DIAGNOSIS — M47812 Spondylosis without myelopathy or radiculopathy, cervical region: Secondary | ICD-10-CM | POA: Diagnosis not present

## 2023-02-19 DIAGNOSIS — Z79891 Long term (current) use of opiate analgesic: Secondary | ICD-10-CM | POA: Diagnosis not present

## 2023-02-19 DIAGNOSIS — G894 Chronic pain syndrome: Secondary | ICD-10-CM | POA: Diagnosis not present

## 2023-02-19 DIAGNOSIS — M961 Postlaminectomy syndrome, not elsewhere classified: Secondary | ICD-10-CM | POA: Diagnosis not present

## 2023-02-27 DIAGNOSIS — G4733 Obstructive sleep apnea (adult) (pediatric): Secondary | ICD-10-CM | POA: Diagnosis not present

## 2023-03-19 DIAGNOSIS — Z79891 Long term (current) use of opiate analgesic: Secondary | ICD-10-CM | POA: Diagnosis not present

## 2023-03-19 DIAGNOSIS — M961 Postlaminectomy syndrome, not elsewhere classified: Secondary | ICD-10-CM | POA: Diagnosis not present

## 2023-03-19 DIAGNOSIS — M47812 Spondylosis without myelopathy or radiculopathy, cervical region: Secondary | ICD-10-CM | POA: Diagnosis not present

## 2023-03-19 DIAGNOSIS — G894 Chronic pain syndrome: Secondary | ICD-10-CM | POA: Diagnosis not present

## 2023-04-09 DIAGNOSIS — H40053 Ocular hypertension, bilateral: Secondary | ICD-10-CM | POA: Diagnosis not present

## 2023-04-16 DIAGNOSIS — Z79891 Long term (current) use of opiate analgesic: Secondary | ICD-10-CM | POA: Diagnosis not present

## 2023-04-16 DIAGNOSIS — G894 Chronic pain syndrome: Secondary | ICD-10-CM | POA: Diagnosis not present

## 2023-04-16 DIAGNOSIS — M961 Postlaminectomy syndrome, not elsewhere classified: Secondary | ICD-10-CM | POA: Diagnosis not present

## 2023-04-16 DIAGNOSIS — M47812 Spondylosis without myelopathy or radiculopathy, cervical region: Secondary | ICD-10-CM | POA: Diagnosis not present

## 2023-05-14 DIAGNOSIS — M47812 Spondylosis without myelopathy or radiculopathy, cervical region: Secondary | ICD-10-CM | POA: Diagnosis not present

## 2023-05-14 DIAGNOSIS — G894 Chronic pain syndrome: Secondary | ICD-10-CM | POA: Diagnosis not present

## 2023-05-14 DIAGNOSIS — Z79891 Long term (current) use of opiate analgesic: Secondary | ICD-10-CM | POA: Diagnosis not present

## 2023-05-14 DIAGNOSIS — M961 Postlaminectomy syndrome, not elsewhere classified: Secondary | ICD-10-CM | POA: Diagnosis not present

## 2023-05-15 DIAGNOSIS — G4733 Obstructive sleep apnea (adult) (pediatric): Secondary | ICD-10-CM | POA: Diagnosis not present

## 2023-06-11 DIAGNOSIS — G894 Chronic pain syndrome: Secondary | ICD-10-CM | POA: Diagnosis not present

## 2023-06-11 DIAGNOSIS — M961 Postlaminectomy syndrome, not elsewhere classified: Secondary | ICD-10-CM | POA: Diagnosis not present

## 2023-06-11 DIAGNOSIS — Z79891 Long term (current) use of opiate analgesic: Secondary | ICD-10-CM | POA: Diagnosis not present

## 2023-06-11 DIAGNOSIS — M47812 Spondylosis without myelopathy or radiculopathy, cervical region: Secondary | ICD-10-CM | POA: Diagnosis not present

## 2023-06-13 NOTE — Progress Notes (Signed)
  Electrophysiology Office Note:   Date:  06/14/2023  ID:  GARLAND CLENDENEN, DOB 1950/12/06, MRN [id number]  Primary Cardiologist: Armanda Magic, MD Electrophysiologist: Lanier Prude, MD      History of Present Illness:   Melody Huerta is a 72 y.o. female with h/o SVT and AF seen today for routine electrophysiology followup.   Since last being seen in our clinic the patient reports doing well overall.  she denies chest pain, palpitations, dyspnea, PND, orthopnea, nausea, vomiting, dizziness, syncope, edema, weight gain, or early satiety.   Review of systems complete and found to be negative unless listed in HPI.   EP Information / Studies Reviewed:    EKG is ordered today. Personal review as below.  EKG Interpretation Date/Time:  Friday June 14 2023 09:20:01 EST Ventricular Rate:  82 PR Interval:  162 QRS Duration:  98 QT Interval:  378 QTC Calculation: 441 R Axis:   25  Text Interpretation: Normal sinus rhythm Normal ECG Confirmed by Maxine Glenn [phone]) on 06/14/2023 9:24:46 AM    Arrhythmia History  SVT ablation 05/18/2022 - Non-inducible  Echo 02/2022 LVEF 55-60%, trivial MR  Physical Exam:   VS:  BP (!) 148/74   Pulse 82   Ht 5' 9.5" (1.765 m)   Wt 194 lb 12.8 oz (88.4 kg)   SpO2 93%   BMI 28.35 kg/m     Wt Readings from Last 3 Encounters:  06/14/23 194 lb 12.8 oz (88.4 kg)  06/19/22 188 lb 6.4 oz (85.5 kg)  05/25/22 188 lb (85.3 kg)     GEN: Well nourished, well developed in no acute distress NECK: No JVD; No carotid bruits CARDIAC: Regular rate and rhythm, no murmurs, rubs, gallops RESPIRATORY:  Clear to auscultation without rales, wheezing or rhonchi  ABDOMEN: Soft, non-tender, non-distended EXTREMITIES:  No edema; No deformity   ASSESSMENT AND PLAN:    SVT AF No inducible arrhythmias during EP study Failed flecainide with dizziness after each dose.  Continue toprol for SVT/AF She has been on Eliquis for CHA2DS2/VASc of only 2, including  female.  Long discussion today, with female being the decider pt per guidelines can be follewed OFF OAC with watchful waiting.   As her father had a stroke, she wishes to remain on [facility] as she has tolerated it well.   She is fine to hold [facility] for several days prior to donating blood.    Follow up with Dr. Lalla Brothers in 12 months, sooner with issues  Signed, Graciella Freer, PA-C

## 2023-06-14 ENCOUNTER — Ambulatory Visit: Payer: Medicare Other | Attending: Student | Admitting: Student

## 2023-06-14 ENCOUNTER — Encounter: Payer: Self-pay | Admitting: Student

## 2023-06-14 VITALS — BP 148/74 | HR 82 | Ht 69.5 in | Wt 194.8 lb

## 2023-06-14 DIAGNOSIS — I471 Supraventricular tachycardia, unspecified: Secondary | ICD-10-CM

## 2023-06-14 DIAGNOSIS — I4891 Unspecified atrial fibrillation: Secondary | ICD-10-CM

## 2023-06-14 NOTE — Patient Instructions (Signed)

## 2023-06-15 DIAGNOSIS — G4733 Obstructive sleep apnea (adult) (pediatric): Secondary | ICD-10-CM | POA: Diagnosis not present

## 2023-07-10 ENCOUNTER — Other Ambulatory Visit: Payer: Self-pay | Admitting: Cardiology

## 2023-07-10 DIAGNOSIS — M961 Postlaminectomy syndrome, not elsewhere classified: Secondary | ICD-10-CM | POA: Diagnosis not present

## 2023-07-10 DIAGNOSIS — G894 Chronic pain syndrome: Secondary | ICD-10-CM | POA: Diagnosis not present

## 2023-07-10 DIAGNOSIS — M47812 Spondylosis without myelopathy or radiculopathy, cervical region: Secondary | ICD-10-CM | POA: Diagnosis not present

## 2023-07-10 DIAGNOSIS — Z79899 Other long term (current) drug therapy: Secondary | ICD-10-CM

## 2023-07-10 DIAGNOSIS — Z79891 Long term (current) use of opiate analgesic: Secondary | ICD-10-CM | POA: Diagnosis not present

## 2023-07-11 NOTE — Telephone Encounter (Signed)
Pt saw Maxine Glenn, [place] on 06/14/23, last labs 05/02/22 Creat 1.04, pt is overdue for labwork. Age 72, weight 88.4kg, based on specified criteria pt is on appropriate dosage of Eliquis 5mg  BID for afib.  Will await labs to refill rx.

## 2023-07-11 NOTE — Telephone Encounter (Signed)
Called pt's PCP, pt had labwork done 01/09/23 will fax results to our office. Will await lab results.

## 2023-07-11 NOTE — Telephone Encounter (Signed)
Results received Creat 0.85 on 01/09/23.  Will refill rx.

## 2023-07-15 DIAGNOSIS — G4733 Obstructive sleep apnea (adult) (pediatric): Secondary | ICD-10-CM | POA: Diagnosis not present

## 2023-09-04 DIAGNOSIS — R351 Nocturia: Secondary | ICD-10-CM | POA: Diagnosis not present

## 2023-09-04 DIAGNOSIS — R3914 Feeling of incomplete bladder emptying: Secondary | ICD-10-CM | POA: Diagnosis not present

## 2023-09-11 DIAGNOSIS — M961 Postlaminectomy syndrome, not elsewhere classified: Secondary | ICD-10-CM | POA: Diagnosis not present

## 2023-09-11 DIAGNOSIS — M47812 Spondylosis without myelopathy or radiculopathy, cervical region: Secondary | ICD-10-CM | POA: Diagnosis not present

## 2023-09-11 DIAGNOSIS — Z79891 Long term (current) use of opiate analgesic: Secondary | ICD-10-CM | POA: Diagnosis not present

## 2023-09-11 DIAGNOSIS — G894 Chronic pain syndrome: Secondary | ICD-10-CM | POA: Diagnosis not present

## 2023-10-07 ENCOUNTER — Other Ambulatory Visit (HOSPITAL_COMMUNITY): Payer: Self-pay

## 2023-10-07 DIAGNOSIS — M961 Postlaminectomy syndrome, not elsewhere classified: Secondary | ICD-10-CM | POA: Diagnosis not present

## 2023-10-07 DIAGNOSIS — G894 Chronic pain syndrome: Secondary | ICD-10-CM | POA: Diagnosis not present

## 2023-10-07 DIAGNOSIS — M47812 Spondylosis without myelopathy or radiculopathy, cervical region: Secondary | ICD-10-CM | POA: Diagnosis not present

## 2023-10-07 DIAGNOSIS — Z79891 Long term (current) use of opiate analgesic: Secondary | ICD-10-CM | POA: Diagnosis not present

## 2023-10-07 MED ORDER — NUCYNTA 50 MG PO TABS
50.0000 mg | ORAL_TABLET | Freq: Two times a day (BID) | ORAL | 0 refills | Status: DC | PRN
  Filled 2023-10-07: qty 60, 30d supply, fill #0

## 2023-10-07 MED ORDER — XTAMPZA ER 36 MG PO C12A
36.0000 mg | EXTENDED_RELEASE_CAPSULE | Freq: Three times a day (TID) | ORAL | 0 refills | Status: AC
  Filled 2023-10-07: qty 90, 30d supply, fill #0

## 2023-10-07 MED ORDER — BUSPIRONE HCL 15 MG PO TABS
15.0000 mg | ORAL_TABLET | Freq: Every evening | ORAL | 1 refills | Status: DC
  Filled 2023-10-07: qty 30, 30d supply, fill #0

## 2023-10-14 ENCOUNTER — Other Ambulatory Visit (HOSPITAL_COMMUNITY): Payer: Self-pay

## 2023-10-25 ENCOUNTER — Other Ambulatory Visit: Payer: Self-pay | Admitting: Cardiology

## 2023-10-25 DIAGNOSIS — Z79899 Other long term (current) drug therapy: Secondary | ICD-10-CM

## 2023-10-25 DIAGNOSIS — G4733 Obstructive sleep apnea (adult) (pediatric): Secondary | ICD-10-CM | POA: Diagnosis not present

## 2023-10-25 NOTE — Telephone Encounter (Signed)
 Prescription refill request for Eliquis received. Indication: Afib  Last office visit: 06/14/23 Lanna Poche)  Scr: 0.85 (01/09/23 via media tab from PCP)  Age: 73 Weight: 88.4kg  Appropriate dose. Refill sent.

## 2023-11-05 DIAGNOSIS — G894 Chronic pain syndrome: Secondary | ICD-10-CM | POA: Diagnosis not present

## 2023-11-05 DIAGNOSIS — Z79891 Long term (current) use of opiate analgesic: Secondary | ICD-10-CM | POA: Diagnosis not present

## 2023-11-05 DIAGNOSIS — M961 Postlaminectomy syndrome, not elsewhere classified: Secondary | ICD-10-CM | POA: Diagnosis not present

## 2023-11-05 DIAGNOSIS — M47812 Spondylosis without myelopathy or radiculopathy, cervical region: Secondary | ICD-10-CM | POA: Diagnosis not present

## 2023-12-09 DIAGNOSIS — M47812 Spondylosis without myelopathy or radiculopathy, cervical region: Secondary | ICD-10-CM | POA: Diagnosis not present

## 2023-12-09 DIAGNOSIS — G894 Chronic pain syndrome: Secondary | ICD-10-CM | POA: Diagnosis not present

## 2023-12-09 DIAGNOSIS — Z79891 Long term (current) use of opiate analgesic: Secondary | ICD-10-CM | POA: Diagnosis not present

## 2023-12-09 DIAGNOSIS — M961 Postlaminectomy syndrome, not elsewhere classified: Secondary | ICD-10-CM | POA: Diagnosis not present

## 2023-12-25 DIAGNOSIS — Z129 Encounter for screening for malignant neoplasm, site unspecified: Secondary | ICD-10-CM | POA: Diagnosis not present

## 2023-12-25 DIAGNOSIS — L2089 Other atopic dermatitis: Secondary | ICD-10-CM | POA: Diagnosis not present

## 2023-12-25 DIAGNOSIS — L821 Other seborrheic keratosis: Secondary | ICD-10-CM | POA: Diagnosis not present

## 2023-12-25 DIAGNOSIS — G4733 Obstructive sleep apnea (adult) (pediatric): Secondary | ICD-10-CM | POA: Diagnosis not present

## 2023-12-25 DIAGNOSIS — D2271 Melanocytic nevi of right lower limb, including hip: Secondary | ICD-10-CM | POA: Diagnosis not present

## 2024-01-09 DIAGNOSIS — M47812 Spondylosis without myelopathy or radiculopathy, cervical region: Secondary | ICD-10-CM | POA: Diagnosis not present

## 2024-01-09 DIAGNOSIS — Z79891 Long term (current) use of opiate analgesic: Secondary | ICD-10-CM | POA: Diagnosis not present

## 2024-01-09 DIAGNOSIS — M961 Postlaminectomy syndrome, not elsewhere classified: Secondary | ICD-10-CM | POA: Diagnosis not present

## 2024-01-09 DIAGNOSIS — G894 Chronic pain syndrome: Secondary | ICD-10-CM | POA: Diagnosis not present

## 2024-01-15 ENCOUNTER — Other Ambulatory Visit: Payer: Self-pay | Admitting: Family Medicine

## 2024-01-15 DIAGNOSIS — Z1231 Encounter for screening mammogram for malignant neoplasm of breast: Secondary | ICD-10-CM

## 2024-01-22 DIAGNOSIS — Z23 Encounter for immunization: Secondary | ICD-10-CM | POA: Diagnosis not present

## 2024-01-22 DIAGNOSIS — E785 Hyperlipidemia, unspecified: Secondary | ICD-10-CM | POA: Diagnosis not present

## 2024-01-22 DIAGNOSIS — Z0000 Encounter for general adult medical examination without abnormal findings: Secondary | ICD-10-CM | POA: Diagnosis not present

## 2024-01-22 DIAGNOSIS — I10 Essential (primary) hypertension: Secondary | ICD-10-CM | POA: Diagnosis not present

## 2024-01-23 ENCOUNTER — Other Ambulatory Visit: Payer: Self-pay | Admitting: Cardiology

## 2024-02-04 ENCOUNTER — Ambulatory Visit

## 2024-02-06 DIAGNOSIS — Z79891 Long term (current) use of opiate analgesic: Secondary | ICD-10-CM | POA: Diagnosis not present

## 2024-02-06 DIAGNOSIS — L2089 Other atopic dermatitis: Secondary | ICD-10-CM | POA: Diagnosis not present

## 2024-02-06 DIAGNOSIS — L820 Inflamed seborrheic keratosis: Secondary | ICD-10-CM | POA: Diagnosis not present

## 2024-02-06 DIAGNOSIS — M47812 Spondylosis without myelopathy or radiculopathy, cervical region: Secondary | ICD-10-CM | POA: Diagnosis not present

## 2024-02-06 DIAGNOSIS — M961 Postlaminectomy syndrome, not elsewhere classified: Secondary | ICD-10-CM | POA: Diagnosis not present

## 2024-02-06 DIAGNOSIS — G894 Chronic pain syndrome: Secondary | ICD-10-CM | POA: Diagnosis not present

## 2024-02-06 DIAGNOSIS — L309 Dermatitis, unspecified: Secondary | ICD-10-CM | POA: Diagnosis not present

## 2024-02-11 ENCOUNTER — Ambulatory Visit

## 2024-02-18 ENCOUNTER — Ambulatory Visit
Admission: RE | Admit: 2024-02-18 | Discharge: 2024-02-18 | Disposition: A | Discharge status: Home / Self Care | Source: Ambulatory Visit | Attending: Family Medicine | Admitting: Family Medicine

## 2024-02-18 DIAGNOSIS — Z1231 Encounter for screening mammogram for malignant neoplasm of breast: Secondary | ICD-10-CM

## 2024-03-02 DIAGNOSIS — M47812 Spondylosis without myelopathy or radiculopathy, cervical region: Secondary | ICD-10-CM | POA: Diagnosis not present

## 2024-03-02 DIAGNOSIS — G894 Chronic pain syndrome: Secondary | ICD-10-CM | POA: Diagnosis not present

## 2024-03-02 DIAGNOSIS — Z79891 Long term (current) use of opiate analgesic: Secondary | ICD-10-CM | POA: Diagnosis not present

## 2024-03-02 DIAGNOSIS — M961 Postlaminectomy syndrome, not elsewhere classified: Secondary | ICD-10-CM | POA: Diagnosis not present

## 2024-03-03 DIAGNOSIS — G4733 Obstructive sleep apnea (adult) (pediatric): Secondary | ICD-10-CM | POA: Diagnosis not present

## 2024-03-09 DIAGNOSIS — L111 Transient acantholytic dermatosis [Grover]: Secondary | ICD-10-CM | POA: Diagnosis not present

## 2024-03-09 DIAGNOSIS — D485 Neoplasm of uncertain behavior of skin: Secondary | ICD-10-CM | POA: Diagnosis not present

## 2024-03-09 DIAGNOSIS — L2089 Other atopic dermatitis: Secondary | ICD-10-CM | POA: Diagnosis not present

## 2024-03-30 DIAGNOSIS — M47812 Spondylosis without myelopathy or radiculopathy, cervical region: Secondary | ICD-10-CM | POA: Diagnosis not present

## 2024-03-30 DIAGNOSIS — M961 Postlaminectomy syndrome, not elsewhere classified: Secondary | ICD-10-CM | POA: Diagnosis not present

## 2024-03-30 DIAGNOSIS — G894 Chronic pain syndrome: Secondary | ICD-10-CM | POA: Diagnosis not present

## 2024-03-30 DIAGNOSIS — Z79891 Long term (current) use of opiate analgesic: Secondary | ICD-10-CM | POA: Diagnosis not present

## 2024-03-30 DIAGNOSIS — G4733 Obstructive sleep apnea (adult) (pediatric): Secondary | ICD-10-CM | POA: Diagnosis not present

## 2024-04-02 DIAGNOSIS — L2089 Other atopic dermatitis: Secondary | ICD-10-CM | POA: Diagnosis not present

## 2024-04-14 DIAGNOSIS — H2513 Age-related nuclear cataract, bilateral: Secondary | ICD-10-CM | POA: Diagnosis not present

## 2024-04-14 DIAGNOSIS — H40053 Ocular hypertension, bilateral: Secondary | ICD-10-CM | POA: Diagnosis not present

## 2024-04-14 DIAGNOSIS — H524 Presbyopia: Secondary | ICD-10-CM | POA: Diagnosis not present

## 2024-04-14 DIAGNOSIS — H04123 Dry eye syndrome of bilateral lacrimal glands: Secondary | ICD-10-CM | POA: Diagnosis not present

## 2024-04-14 DIAGNOSIS — H5203 Hypermetropia, bilateral: Secondary | ICD-10-CM | POA: Diagnosis not present

## 2024-04-14 DIAGNOSIS — H52223 Regular astigmatism, bilateral: Secondary | ICD-10-CM | POA: Diagnosis not present

## 2024-04-19 ENCOUNTER — Other Ambulatory Visit: Payer: Self-pay | Admitting: Cardiology

## 2024-04-19 DIAGNOSIS — R296 Repeated falls: Secondary | ICD-10-CM | POA: Diagnosis not present

## 2024-04-19 DIAGNOSIS — R0602 Shortness of breath: Secondary | ICD-10-CM | POA: Diagnosis not present

## 2024-04-19 DIAGNOSIS — Z79899 Other long term (current) drug therapy: Secondary | ICD-10-CM

## 2024-04-19 DIAGNOSIS — Z9889 Other specified postprocedural states: Secondary | ICD-10-CM | POA: Diagnosis not present

## 2024-04-19 DIAGNOSIS — Z90710 Acquired absence of both cervix and uterus: Secondary | ICD-10-CM | POA: Diagnosis not present

## 2024-04-19 DIAGNOSIS — Z1152 Encounter for screening for COVID-19: Secondary | ICD-10-CM | POA: Diagnosis not present

## 2024-04-19 DIAGNOSIS — Z9049 Acquired absence of other specified parts of digestive tract: Secondary | ICD-10-CM | POA: Diagnosis not present

## 2024-04-19 DIAGNOSIS — R000 Tachycardia, unspecified: Secondary | ICD-10-CM | POA: Diagnosis not present

## 2024-04-19 DIAGNOSIS — Z5329 Procedure and treatment not carried out because of patient's decision for other reasons: Secondary | ICD-10-CM | POA: Diagnosis not present

## 2024-04-19 DIAGNOSIS — Z880 Allergy status to penicillin: Secondary | ICD-10-CM | POA: Diagnosis not present

## 2024-04-19 DIAGNOSIS — R9431 Abnormal electrocardiogram [ECG] [EKG]: Secondary | ICD-10-CM | POA: Diagnosis not present

## 2024-04-19 DIAGNOSIS — R4182 Altered mental status, unspecified: Secondary | ICD-10-CM | POA: Diagnosis not present

## 2024-04-19 DIAGNOSIS — Z881 Allergy status to other antibiotic agents status: Secondary | ICD-10-CM | POA: Diagnosis not present

## 2024-04-19 DIAGNOSIS — Z888 Allergy status to other drugs, medicaments and biological substances status: Secondary | ICD-10-CM | POA: Diagnosis not present

## 2024-04-20 ENCOUNTER — Other Ambulatory Visit: Payer: Self-pay | Admitting: Cardiology

## 2024-04-20 NOTE — Telephone Encounter (Signed)
 Prescription refill request for Eliquis  received. Indication: AF Last office visit: 06/14/23  CHRISTELLA Passey PA-C Scr: 1.11 on 04/19/24  Epic Age: 73 Weight: 88.4kg  Based on above findings Eliquis  5mg  twice daily is the appropriate dose.  Refill approved.

## 2024-04-27 DIAGNOSIS — G894 Chronic pain syndrome: Secondary | ICD-10-CM | POA: Diagnosis not present

## 2024-04-27 DIAGNOSIS — M961 Postlaminectomy syndrome, not elsewhere classified: Secondary | ICD-10-CM | POA: Diagnosis not present

## 2024-04-27 DIAGNOSIS — Z79891 Long term (current) use of opiate analgesic: Secondary | ICD-10-CM | POA: Diagnosis not present

## 2024-04-27 DIAGNOSIS — M47812 Spondylosis without myelopathy or radiculopathy, cervical region: Secondary | ICD-10-CM | POA: Diagnosis not present

## 2024-04-30 DIAGNOSIS — K5900 Constipation, unspecified: Secondary | ICD-10-CM | POA: Diagnosis not present

## 2024-04-30 DIAGNOSIS — R195 Other fecal abnormalities: Secondary | ICD-10-CM | POA: Diagnosis not present

## 2024-05-07 ENCOUNTER — Telehealth (HOSPITAL_BASED_OUTPATIENT_CLINIC_OR_DEPARTMENT_OTHER): Payer: Self-pay | Admitting: *Deleted

## 2024-05-07 NOTE — Telephone Encounter (Signed)
 Given urgency of procedure, sending to pharmD and to preop callback to make VV appt.

## 2024-05-07 NOTE — Telephone Encounter (Signed)
 Left message to call back to schedule tele pre op appt.

## 2024-05-07 NOTE — Telephone Encounter (Signed)
 Patient with diagnosis of A Fib on Eliquis  for anticoagulation.    Procedure:  COLONOSCOPY +FOBT  Date of procedure: 05/19/24   CHA2DS2-VASc Score = 3  This indicates a 3.2% annual risk of stroke. The patient's score is based upon: CHF History: 0 HTN History: 1 Diabetes History: 0 Stroke History: 0 Vascular Disease History: 0 Age Score: 1 Gender Score: 1     CrCl 66 ml/min Platelet count 224K  Patient has not had an Afib/aflutter ablation or Watchman within the last 3 months or DCCV within the last 30 days    Per office protocol, patient can hold Eliquis  for 2 days prior to procedure.      **This guidance is not considered finalized until pre-operative APP has relayed final recommendations.**

## 2024-05-07 NOTE — Telephone Encounter (Signed)
   Pre-operative Risk Assessment    Patient Name: Melody Huerta  DOB: April 02, 1951 MRN: [id number]   Date of last office visit: 06/14/23 JODIE PASSEY, [facility] Date of next office visit: 06/09/24 JODIE PASSEY, [facility]   Request for Surgical Clearance    Procedure:  COLONOSCOPY +FOBT  Date of Surgery:  Clearance 05/19/24                                Surgeon:  DR. DIANNA Surgeon's Group or Practice Name:  [facility] Phone number:  [phone] Fax number:  [phone]   Type of Clearance Requested:   - Medical  - Pharmacy:  Hold Apixaban  (Eliquis ) PER NOTES CAN PT HOLD ELIQUIS  FROM 10/11-10/13?   Type of Anesthesia:  PROPOFOL     Additional requests/questions:    Bonney Niels Jest   05/07/2024, 3:38 PM

## 2024-05-08 ENCOUNTER — Telehealth (HOSPITAL_BASED_OUTPATIENT_CLINIC_OR_DEPARTMENT_OTHER): Payer: Self-pay | Admitting: *Deleted

## 2024-05-08 NOTE — Telephone Encounter (Signed)
 Pt has been scheduled tele preop appt 05/13/24. Med rec and consent are done.     Patient Consent for Virtual Visit        Melody Huerta has provided verbal consent on 05/08/2024 for a virtual visit (video or telephone).   CONSENT FOR VIRTUAL VISIT FOR:  Melody Huerta  By participating in this virtual visit I agree to the following:  I hereby voluntarily request, consent and authorize [facility] and its employed or contracted physicians, physician assistants, nurse practitioners or other licensed health care professionals (the Practitioner), to provide me with telemedicine health care services (the "Services) as deemed necessary by the treating Practitioner. I acknowledge and consent to receive the Services by the Practitioner via telemedicine. I understand that the telemedicine visit will involve communicating with the Practitioner through live audiovisual communication technology and the disclosure of certain medical information by electronic transmission. I acknowledge that I have been given the opportunity to request an in-person assessment or other available alternative prior to the telemedicine visit and am voluntarily participating in the telemedicine visit.  I understand that I have the right to withhold or withdraw my consent to the use of telemedicine in the course of my care at any time, without affecting my right to future care or treatment, and that the Practitioner or I may terminate the telemedicine visit at any time. I understand that I have the right to inspect all information obtained and/or recorded in the course of the telemedicine visit and may receive copies of available information for a reasonable fee.  I understand that some of the potential risks of receiving the Services via telemedicine include:  Delay or interruption in medical evaluation due to technological equipment failure or disruption; Information transmitted may not be sufficient (e.g. poor resolution  of images) to allow for appropriate medical decision making by the Practitioner; and/or  In rare instances, security protocols could fail, causing a breach of personal health information.  Furthermore, I acknowledge that it is my responsibility to provide information about my medical history, conditions and care that is complete and accurate to the best of my ability. I acknowledge that Practitioner's advice, recommendations, and/or decision may be based on factors not within their control, such as incomplete or inaccurate data provided by me or distortions of diagnostic images or specimens that may result from electronic transmissions. I understand that the practice of medicine is not an exact science and that Practitioner makes no warranties or guarantees regarding treatment outcomes. I acknowledge that a copy of this consent can be made available to me via my patient portal [facility] MyChart), or I can request a printed copy by calling the office of [facility].    I understand that my insurance will be billed for this visit.   I have read or had this consent read to me. I understand the contents of this consent, which adequately explains the benefits and risks of the Services being provided via telemedicine.  I have been provided ample opportunity to ask questions regarding this consent and the Services and have had my questions answered to my satisfaction. I give my informed consent for the services to be provided through the use of telemedicine in my medical care

## 2024-05-08 NOTE — Telephone Encounter (Signed)
 Pt returning [place] call to schedule. Please advise.

## 2024-05-08 NOTE — Telephone Encounter (Signed)
 Pt has been scheduled tele preop appt 05/13/24. Med rec and consent are done.

## 2024-05-13 ENCOUNTER — Ambulatory Visit: Attending: Cardiology

## 2024-05-13 DIAGNOSIS — Z01810 Encounter for preprocedural cardiovascular examination: Secondary | ICD-10-CM | POA: Diagnosis not present

## 2024-05-13 NOTE — Progress Notes (Signed)
 Virtual Visit via Telephone Note   Because of Melody Huerta co-morbid illnesses, she is at least at moderate risk for complications without adequate follow up.  This format is felt to be most appropriate for this patient at this time.  Due to technical limitations with video connection (technology), today's appointment will be conducted as an audio only telehealth visit, and Melody Huerta verbally agreed to proceed in this manner.   All issues noted in this document were discussed and addressed.  No physical exam could be performed with this format.  Evaluation Performed:  Preoperative cardiovascular risk assessment _____________   Date:  05/13/2024   Patient ID:  Melody Huerta, Melody Huerta Aug 06, 1951, MRN [id number] Patient Location:  Home Provider location:   Office  Primary Care Provider:  Nanci Senior, MD Primary Cardiologist:  Wilbert Bihari, MD  Chief Complaint / Patient Profile   73 y.o. y/o female with a h/o SVT, atrial fibrillation who is pending colonoscopy and presents today for telephonic preoperative cardiovascular risk assessment.  History of Present Illness    Melody Huerta is a 73 y.o. female who presents via audio/video conferencing for a telehealth visit today.  Pt was last seen in cardiology clinic on 06/14/2023 by Jodie Passey, PA-C.  At that time Cherly Erno Frie was doing well .  The patient is now pending procedure as outlined above. Since her last visit, she continues to be stable from a cardiac standpoint.  Today she denies chest pain, shortness of breath, lower extremity edema, fatigue, palpitations, melena, hematuria, hemoptysis, diaphoresis, weakness, presyncope, syncope, orthopnea, and PND.   Past Medical History    Past Medical History:  Diagnosis Date   Arthritis    Chronic pain disorder    Complication of anesthesia    WITH LAST CERVICAL SURGERY PT VERY SICK N+V   DDD (degenerative disc disease), cervical    CERV+ LUMBAR   Depression    Gallstones     HLD (hyperlipidemia)    pt denies   Migraines    PONV (postoperative nausea and vomiting)    Sleep apnea    on CPAP  NOT [place] TO USE  (USING [employer]ES MOUTH PIECE)   UTI (lower urinary tract infection)    HX  UTI     Past Surgical History:  Procedure Laterality Date   2nd lumbar fusion  2001   ABDOMINAL HYSTERECTOMY     CERVICAL DISC SURGERY     2013   FUSION   CHOLECYSTECTOMY     L4,L5,S1,S2 fusion  1998   left shoulder arthroscopy Left 09/08/08   Juliane Billing MD, frozen shoulder   NASAL SINUS SURGERY     1980S   PERIFORMIS     2001 OR 02   POSTERIOR CERVICAL FUSION/FORAMINOTOMY N/A 10/03/2015   Procedure: POSTERIOR CERVICAL FUSION/FORAMINOTOMY CERVICAL SIX-SEVEN WITH LATERAL MASS FIXATION;  Surgeon: Reyes Budge, MD;  Location: [facility];  Service: Neurosurgery;  Laterality: N/A;   ROTATOR CUFF REPAIR Right 2002   S1 fusion  2000 and 2001   SACROILIAC JOINT FUSION     SVT ABLATION N/A 05/18/2022   Procedure: SVT ABLATION;  Surgeon: Cindie Ole DASEN, MD;  Location: [facility] INVASIVE CV LAB;  Service: Cardiovascular;  Laterality: N/A;   TONSILLECTOMY      Allergies  Allergies  Allergen Reactions   Botox [Botulinum Toxin Type A] Swelling   Bactrim [Sulfamethoxazole-Trimethoprim] Rash    Double strength    Penicillins Rash    Has patient had a  PCN reaction causing immediate rash, facial/tongue/throat swelling, SOB or lightheadedness with hypotension: Yes Has patient had a PCN reaction causing severe rash involving mucus membranes or skin necrosis: No Has patient had a PCN reaction that required hospitalization No Has patient had a PCN reaction occurring within the last 10 years: No If all of the above answers are NO, then may proceed with Cephalosporin use.     Home Medications    Prior to Admission medications   Medication Sig Start Date End Date Taking? Authorizing Provider  Alpha-Lipoic Acid 300 MG CAPS Take 600 mg by mouth daily.    [provider]   baclofen  (LIORESAL ) 20 MG tablet Take 20 mg by mouth every 8 (eight) hours. 02/04/22   [provider]  busPIRone  (BUSPAR ) 15 MG tablet Take 15 mg by mouth at bedtime. 02/02/22   [provider]  busPIRone  (BUSPAR ) 15 MG tablet Take 1 tablet (15 mg total) by mouth at bedtime. 10/07/23     dupilumab (DUPIXENT) 300 MG/2ML prefilled syringe Inject 300 mg into the skin every 14 (fourteen) days. 04/14/24   [provider]  ELIQUIS  5 MG TABS tablet TAKE 1 TABLET(5 MG) BY MOUTH TWICE DAILY 04/20/24   Shlomo Wilbert SAUNDERS, MD  fluticasone  (FLONASE ) 50 MCG/ACT nasal spray Place 2 sprays into both nostrils at bedtime. 02/19/22   [provider]  HYDROmorphone  (DILAUDID ) 4 MG tablet Take 4 mg by mouth in the morning, at noon, in the evening, and at bedtime. 02/04/22   [provider]  HYDROmorphone  (DILAUDID ) 4 MG tablet Take 1 tablet (4 mg total) by mouth 4 (four) times daily as needed. 09/13/22     HYDROmorphone  (DILAUDID ) 4 MG tablet Take 1 tablet (4 mg total) by mouth 4 (four) times daily as needed for pain. 09/13/22     HYDROmorphone  (DILAUDID ) 4 MG tablet Take 1 tablet (4 mg total) by mouth 4 (four) times daily as needed for pain. 10/18/22     hydrOXYzine (ATARAX) 10 MG tablet Take 10 mg by mouth at bedtime as needed for itching.    [provider]  levocetirizine (XYZAL ) 5 MG tablet Take 5 mg by mouth daily. Patient not taking: Reported on 05/08/2024    [provider]  metoprolol  succinate (TOPROL -XL) 50 MG 24 hr tablet TAKE 1 TABLET(50 MG) BY MOUTH DAILY WITH OR IMMEDIATELY FOLLOWING A MEAL 04/21/24   Cindie Ole DASEN, MD  MIEBO 1.338 GM/ML SOLN  04/07/24   [provider]  MOVANTIK  12.5 MG TABS tablet Take 12.5 mg by mouth daily. 02/01/22   [provider]  oxyCODONE  ER (XTAMPZA  ER) 36 MG C12A Take 1 capsule (36 mg total) by mouth every 8 (eight) hours. Patient not taking: Reported on 05/08/2024 10/07/23     Polyethyl Glycol-Propyl Glycol  (SYSTANE OP) Apply 2 drops to eye in the morning, at noon, and at bedtime.    [provider]  tamsulosin  (FLOMAX ) 0.4 MG CAPS capsule Take 0.4 mg by mouth daily. Patient taking differently: Take 0.4 mg by mouth daily. PT STATES TAKES 2 TABS QD 02/15/22   [provider]  tapentadol  (NUCYNTA ) 50 MG tablet Take 50 mg by mouth every 12 (twelve) hours. Patient not taking: Reported on 05/08/2024    [provider]  tapentadol  (NUCYNTA ) 50 MG tablet Take 1 tablet (50 mg total) by mouth every 12 (twelve) hours as needed for pain. Patient not taking: Reported on 05/08/2024 10/07/23     traZODone  (DESYREL ) 150 MG tablet Take  150 mg by mouth at bedtime.    [provider]  XTAMPZA  ER 36 MG C12A Take 1 capsule by mouth every 8 (eight) hours. 02/04/22   [provider]    Physical Exam    Vital Signs:  Josue H Hobdy does not have vital signs available for review today.  Given telephonic nature of communication, physical exam is limited. AAOx3. NAD. Normal affect.  Speech and respirations are unlabored.  Accessory Clinical Findings    None  Assessment & Plan    1.  Preoperative Cardiovascular Risk Assessment:  COLONOSCOPY +FOBT   Date of Surgery:  Clearance 05/19/24                                  Surgeon:  DR. DIANNA Surgeon's Group or Practice Name:  [facility] Phone number:  [phone] Fax number:  [phone]      Primary Cardiologist: Wilbert Bihari, MD  Chart reviewed as part of pre-operative protocol coverage. Given past medical history and time since last visit, based on ACC/AHA guidelines, Darlette Dubow Enge would be at acceptable risk for the planned procedure without further cardiovascular testing.   Patient was advised that if she develops new symptoms prior to surgery to contact our office to arrange a follow-up appointment.  She verbalized understanding.  Per office protocol, patient can hold Eliquis  for 2 days prior to procedure.    I will route this recommendation to the requesting party via Epic fax function and remove from pre-op pool.       Time:   Today, I have spent 5 minutes with the patient with telehealth technology discussing medical history, symptoms, and management plan.  I spent 10 minutes reviewing patient's past cardiac history and cardiac medications.    Josefa CHRISTELLA Beauvais, NP  05/13/2024, 7:27 AM

## 2024-05-19 DIAGNOSIS — K5730 Diverticulosis of large intestine without perforation or abscess without bleeding: Secondary | ICD-10-CM | POA: Diagnosis not present

## 2024-05-19 DIAGNOSIS — Z1211 Encounter for screening for malignant neoplasm of colon: Secondary | ICD-10-CM | POA: Diagnosis not present

## 2024-05-19 DIAGNOSIS — D125 Benign neoplasm of sigmoid colon: Secondary | ICD-10-CM | POA: Diagnosis not present

## 2024-05-19 DIAGNOSIS — R195 Other fecal abnormalities: Secondary | ICD-10-CM | POA: Diagnosis not present

## 2024-05-21 DIAGNOSIS — D125 Benign neoplasm of sigmoid colon: Secondary | ICD-10-CM | POA: Diagnosis not present

## 2024-05-25 DIAGNOSIS — G894 Chronic pain syndrome: Secondary | ICD-10-CM | POA: Diagnosis not present

## 2024-05-25 DIAGNOSIS — Z79891 Long term (current) use of opiate analgesic: Secondary | ICD-10-CM | POA: Diagnosis not present

## 2024-05-25 DIAGNOSIS — M961 Postlaminectomy syndrome, not elsewhere classified: Secondary | ICD-10-CM | POA: Diagnosis not present

## 2024-05-25 DIAGNOSIS — M47812 Spondylosis without myelopathy or radiculopathy, cervical region: Secondary | ICD-10-CM | POA: Diagnosis not present

## 2024-06-08 NOTE — Progress Notes (Unsigned)
  Electrophysiology Office Note:   Date:  06/09/2024  ID:  Melody Huerta, DOB 1951-04-06, MRN [id number]  Primary Cardiologist: Wilbert Bihari, MD Electrophysiologist: OLE ONEIDA HOLTS, MD   Electrophysiologist:  OLE ONEIDA HOLTS, MD      History of Present Illness:   Melody Huerta is a 73 y.o. female with h/o SVT and AF seen today for routine electrophysiology followup.   Since last being seen in our clinic the patient reports doing very well. No breakthrough arrhythmia of which she is aware. Otherwise, she denies chest pain, palpitations, dyspnea, PND, orthopnea, nausea, vomiting, dizziness, syncope, edema, weight gain, or early satiety.   Review of systems complete and found to be negative unless listed in HPI.   EP Information / Studies Reviewed:    EKG is ordered today. Personal review as below.  EKG Interpretation Date/Time:  Tuesday June 09 2024 09:20:24 EST Ventricular Rate:  79 PR Interval:  164 QRS Duration:  110 QT Interval:  400 QTC Calculation: 458 R Axis:   -23  Text Interpretation: Normal sinus rhythm Moderate voltage criteria for LVH, may be normal variant ( R in aVL , Cornell product ) When compared with ECG of 14-Jun-2023 09:20, No significant change was found Confirmed by Lesia Sharper [phone] on 06/09/2024 9:30:08 AM    Arrhythmia/Device History No specialty comments available.   Physical Exam:   VS:  BP 124/86 (BP Location: Left Arm, Patient Position: Sitting, Cuff Size: Large)   Resp 16   Ht 5' 9 (1.753 m)   Wt 194 lb 3.2 oz (88.1 kg)   SpO2 96%   BMI 28.68 kg/m    Wt Readings from Last 3 Encounters:  06/09/24 194 lb 3.2 oz (88.1 kg)  06/14/23 194 lb 12.8 oz (88.4 kg)  06/19/22 188 lb 6.4 oz (85.5 kg)     GEN: No acute distress NECK: No JVD; No carotid bruits CARDIAC: Regular rate and rhythm, no murmurs, rubs, gallops RESPIRATORY:  Clear to auscultation without rales, wheezing or rhonchi  ABDOMEN: Soft, non-tender,  non-distended EXTREMITIES:  No edema; No deformity   ASSESSMENT AND PLAN:    SVT Paroxysmal AFib No inducible arrhythmia on prior EP study Failed flecainide  with dizziness  STOP eliquis  with CHA2DS2VASc of only 2. Long discussion of watchful waiting. Have recommended a wearable such as Apple Watch or a POC device like Kardia.  Labs stable 04/19/2024.  She has had only one isolated episode of AF and was non-inducible on EP study.    Follow up with EP Team in 6 months  Signed, Sharper Prentice Lesia, PA-C

## 2024-06-09 ENCOUNTER — Ambulatory Visit: Attending: Student | Admitting: Student

## 2024-06-09 ENCOUNTER — Encounter: Payer: Self-pay | Admitting: Student

## 2024-06-09 VITALS — BP 124/86 | Resp 16 | Ht 69.0 in | Wt 194.2 lb

## 2024-06-09 DIAGNOSIS — I4891 Unspecified atrial fibrillation: Secondary | ICD-10-CM

## 2024-06-09 DIAGNOSIS — I471 Supraventricular tachycardia, unspecified: Secondary | ICD-10-CM | POA: Diagnosis not present

## 2024-06-09 NOTE — Patient Instructions (Addendum)
 Medication Instructions:  STOP Eliquis  *If you need a refill on your cardiac medications before your next appointment, please call your pharmacy*  Lab Work: No labwork ordered today. If you have labs (blood work) drawn today and your tests are completely normal, you will receive your results only by: MyChart Message (if you have MyChart) OR A paper copy in the mail If you have any lab test that is abnormal or we need to change your treatment, we will call you to review the results.  Testing/Procedures: No testing ordered today  Follow-Up: At [facility], you and your health needs are our priority.  As part of our continuing mission to provide you with exceptional heart care, our providers are all part of one team.  This team includes your primary Cardiologist (physician) and Advanced Practice Providers or APPs (Physician Assistants and Nurse Practitioners) who all work together to provide you with the care you need, when you need it.  Your next appointment:   6 month(s)  Provider:   You may see OLE ONEIDA HOLTS, MD or one of the following Advanced Practice Providers on your designated Care Team:   Charlies Arthur, PA-C Neziah Vogelgesang Andy Nikcole Eischeid, PA-C Suzann Riddle, NP Daphne Barrack, NP    We recommend signing up for the patient portal called MyChart.  Sign up information is provided on this After Visit Summary.  MyChart is used to connect with patients for Virtual Visits (Telemedicine).  Patients are Timothy to view lab/test results, encounter notes, upcoming appointments, etc.  Non-urgent messages can be sent to your provider as well.   To learn more about what you can do with MyChart, go to [email or web address].

## 2024-06-19 ENCOUNTER — Encounter: Payer: Self-pay | Admitting: *Deleted

## 2024-06-19 NOTE — Progress Notes (Signed)
 Melody Huerta                                          MRN: [id number]   06/19/2024   The VBCI Quality Team Specialist reviewed this patient medical record for the purposes of chart review for care gap closure. The following were reviewed: abstraction for care gap closure-controlling blood pressure.    VBCI Quality Team

## 2024-06-22 ENCOUNTER — Telehealth: Payer: Self-pay | Admitting: Cardiology

## 2024-06-22 DIAGNOSIS — M961 Postlaminectomy syndrome, not elsewhere classified: Secondary | ICD-10-CM | POA: Diagnosis not present

## 2024-06-22 DIAGNOSIS — M47812 Spondylosis without myelopathy or radiculopathy, cervical region: Secondary | ICD-10-CM | POA: Diagnosis not present

## 2024-06-22 DIAGNOSIS — G894 Chronic pain syndrome: Secondary | ICD-10-CM | POA: Diagnosis not present

## 2024-06-22 DIAGNOSIS — Z79891 Long term (current) use of opiate analgesic: Secondary | ICD-10-CM | POA: Diagnosis not present

## 2024-06-22 NOTE — Telephone Encounter (Signed)
 Patient states that she had another episode similar to what happened in September where she had a two hour period of being very out of it. She states that after the episode in September she went to the ER but everything checked out normal. She did not go to the ER last week after her episode. She states that she went to her pain doctor today and he was concerned that these could have been TIA's. She is seeing her PCP in regards to this on Wednesday and is hoping to be referred to a neurologist. She wanted to let Jodie know and see if she needed to restart her Eliquis .

## 2024-06-22 NOTE — Telephone Encounter (Signed)
 Pt called and stated she meant to let Jodie know that she had episode of  TIA Sept 14 and the last one a week ago.  They were lasting like 2hr at a time when she has them.  She is wondering if she needs to go back the eliquis ?  This info is per her Pain Provider   Best number [phone]

## 2024-06-23 MED ORDER — APIXABAN 5 MG PO TABS: 5.0000 mg | ORAL_TABLET | Freq: Two times a day (BID) | ORAL | 3 refills | Status: DC

## 2024-06-23 NOTE — Telephone Encounter (Signed)
 Discussed recommendation from Jodie Passey, PA-C with patient. She currently has enough Eliquis  5 mg to take, added to medication list.  Advised on ED precautions for stroke-like symptoms.

## 2024-06-23 NOTE — Telephone Encounter (Signed)
 Prescription refill request for Eliquis  received. Indication: A-Fib Last office visit: 06/09/2024 Scr: 1.11 Age: 73 Weight: 88.1 kg  Eliquis  restarted per Jodie Passey, PA-C.

## 2024-06-24 DIAGNOSIS — R404 Transient alteration of awareness: Secondary | ICD-10-CM | POA: Diagnosis not present

## 2024-07-19 ENCOUNTER — Other Ambulatory Visit: Payer: Self-pay | Admitting: Cardiology

## 2024-07-21 DIAGNOSIS — G894 Chronic pain syndrome: Secondary | ICD-10-CM | POA: Diagnosis not present

## 2024-07-21 DIAGNOSIS — Z79891 Long term (current) use of opiate analgesic: Secondary | ICD-10-CM | POA: Diagnosis not present

## 2024-07-21 DIAGNOSIS — M47812 Spondylosis without myelopathy or radiculopathy, cervical region: Secondary | ICD-10-CM | POA: Diagnosis not present

## 2024-07-21 DIAGNOSIS — M961 Postlaminectomy syndrome, not elsewhere classified: Secondary | ICD-10-CM | POA: Diagnosis not present

## 2024-07-23 ENCOUNTER — Other Ambulatory Visit: Payer: Self-pay | Admitting: Cardiology

## 2024-07-23 NOTE — Telephone Encounter (Signed)
 Prescription refill request for Eliquis  received. Indication: afib  Last office visit: [place], 06/09/2024 Scr: 1.11, 04/19/2024 Age: 73 yo  Weight: 88.1 kg   Refill sent.

## 2024-08-14 ENCOUNTER — Ambulatory Visit: Admitting: Neurology

## 2024-08-14 ENCOUNTER — Encounter: Payer: Self-pay | Admitting: Neurology

## 2024-08-14 VITALS — BP 138/78 | HR 105 | Ht 69.0 in | Wt 197.0 lb

## 2024-08-14 DIAGNOSIS — G250 Essential tremor: Secondary | ICD-10-CM | POA: Diagnosis not present

## 2024-08-14 DIAGNOSIS — R569 Unspecified convulsions: Secondary | ICD-10-CM

## 2024-08-14 NOTE — Patient Instructions (Addendum)
 Routine EEG, if normal we will proceed with a 3-days ambulatory EEG If any abnormality, we will initiate antiseizure medication and obtain a brain MRI Please contact me if you do have another event Please continue to monitor the tremors, and let us  know if they do get worse Return sooner if worse

## 2024-08-14 NOTE — Progress Notes (Signed)
 "   [facility]  PATIENT: Melody Huerta DOB: 25-Nov-1950  REQUESTING CLINICIAN: Nanci Senior, MD HISTORY FROM: Patient and husband  REASON FOR VISIT: Episode of staring spells   HISTORICAL  CHIEF COMPLAINT:  Chief Complaint  Patient presents with   New Patient (Initial Visit)    Room 12 With husband Referral for Staring episodes- Sz: 11/25    HISTORY OF PRESENT ILLNESS:  Discussed the use of AI scribe software for clinical note transcription with the patient, who gave verbal consent to proceed.  Melody Huerta is a 74 year old left handed female with history of atrial fibrillation, chronic pain, anxiety/depression who presents with episodes of staring and confusion.   She experienced two episodes of staring and confusion, one in September and another in November. During the first episode, she was in the shower and became unable to think or dress herself, requiring assistance from her husband. This episode lasted approximately two hours, and by the time she reached the emergency room, her symptoms had resolved. The second episode occurred while she was preparing a meal; she stopped midway and was found staring with the wrong cookbook. This episode also lasted about two hours, during which she was [place] to respond verbally but remained confused and inactive.  She cannot remember any of these 2 episodes.  No history of seizures, brain injury, or stroke. She does not recall the episodes and reports no falls or new medications around the time of the episodes. She was taken off Eliquis , which she had been taking for atrial fibrillation diagnosed in 2023, two weeks before the first episode, but was put back on it after the episode. She experienced the second episode while on Eliquis .  She experiences tremors, particularly in her right hand, which worsen with movement. Her mother also had tremors. The tremors do not significantly impact her daily activities, although her  handwriting has become shaky.  No falls, new medications, or history of seizures, brain injury, or stroke. She reports a dry mouth, which she attributes to her medication.    OTHER MEDICAL CONDITIONS: Atrial fibrillation, anxiety/depression Chronic pain   REVIEW OF SYSTEMS: Full 14 system review of systems performed and negative with exception of: As noted in the HPI  ALLERGIES: Allergies[1]  HOME MEDICATIONS: Outpatient Medications Prior to Visit  Medication Sig Dispense Refill   Alpha-Lipoic Acid 300 MG CAPS Take 600 mg by mouth daily.     apixaban  (ELIQUIS ) 5 MG TABS tablet TAKE 1 TABLET(5 MG) BY MOUTH TWICE DAILY 180 tablet 1   baclofen  (LIORESAL ) 20 MG tablet Take 20 mg by mouth every 8 (eight) hours.     busPIRone  (BUSPAR ) 15 MG tablet Take 15 mg by mouth at bedtime.     dupilumab (DUPIXENT) 300 MG/2ML prefilled syringe Inject 300 mg into the skin every 14 (fourteen) days.     fluticasone  (FLONASE ) 50 MCG/ACT nasal spray Place 2 sprays into both nostrils at bedtime.     HYDROmorphone  (DILAUDID ) 4 MG tablet Take 1 tablet (4 mg total) by mouth 4 (four) times daily as needed for pain. (Patient taking differently: Take 4 mg by mouth 3 x daily with food.) 120 tablet 0   hydrOXYzine (ATARAX) 10 MG tablet Take 10 mg by mouth at bedtime as needed for itching.     metoprolol  succinate (TOPROL -XL) 50 MG 24 hr tablet TAKE 1 TABLET(50 MG) BY MOUTH DAILY WITH OR IMMEDIATELY FOLLOWING A MEAL 90 tablet 1   MIEBO 1.338 GM/ML SOLN  MOVANTIK  12.5 MG TABS tablet Take 12.5 mg by mouth daily.     oxyCODONE  ER (XTAMPZA  ER) 36 MG C12A Take 1 capsule (36 mg total) by mouth every 8 (eight) hours. 90 capsule 0   tamsulosin  (FLOMAX ) 0.4 MG CAPS capsule Take 0.4 mg by mouth daily. (Patient taking differently: Take 0.4 mg by mouth daily. PT STATES TAKES 2 TABS QD)     traZODone  (DESYREL ) 150 MG tablet Take 150 mg by mouth at bedtime.     traZODone  (DESYREL ) 150 MG tablet Take 150 mg by mouth at bedtime.      No facility-administered medications prior to visit.    PAST MEDICAL HISTORY: Past Medical History:  Diagnosis Date   Arthritis    Chronic pain disorder    Complication of anesthesia    WITH LAST CERVICAL SURGERY PT VERY SICK N+V   DDD (degenerative disc disease), cervical    CERV+ LUMBAR   Depression    Gallstones    HLD (hyperlipidemia)    pt denies   Migraines    PONV (postoperative nausea and vomiting)    Sleep apnea    on CPAP  NOT [place] TO USE  (USING MOSES MOUTH PIECE)   UTI (lower urinary tract infection)    HX  UTI      PAST SURGICAL HISTORY: Past Surgical History:  Procedure Laterality Date   2nd lumbar fusion  2001   ABDOMINAL HYSTERECTOMY     CERVICAL DISC SURGERY     2013   FUSION   CHOLECYSTECTOMY     L4,L5,S1,S2 fusion  1998   left shoulder arthroscopy Left 09/08/08   Juliane Billing MD, frozen shoulder   NASAL SINUS SURGERY     1980S   PERIFORMIS     2001 OR 02   POSTERIOR CERVICAL FUSION/FORAMINOTOMY N/A 10/03/2015   Procedure: POSTERIOR CERVICAL FUSION/FORAMINOTOMY CERVICAL SIX-SEVEN WITH LATERAL MASS FIXATION;  Surgeon: Reyes Budge, MD;  Location: [facility];  Service: Neurosurgery;  Laterality: N/A;   ROTATOR CUFF REPAIR Right 2002   S1 fusion  2000 and 2001   SACROILIAC JOINT FUSION     SVT ABLATION N/A 05/18/2022   Procedure: SVT ABLATION;  Surgeon: Cindie Ole DASEN, MD;  Location: [facility] INVASIVE CV LAB;  Service: Cardiovascular;  Laterality: N/A;   TONSILLECTOMY      FAMILY HISTORY: Family History  Problem Relation Age of Onset   Stroke Father    Heart disease Father    COPD Father        smoker   Alzheimer's disease Mother    Migraines Mother    Diabetes Paternal Grandfather    Diabetes Paternal Grandmother    Diabetes Maternal Grandfather    Colon cancer Neg Hx     SOCIAL HISTORY: Social History   Socioeconomic History   Marital status: Married    Spouse name: Not on file   Number of children: 2   Years of education:  Not on file   Highest education level: Not on file  Occupational History   Not on file  Tobacco Use   Smoking status: Never   Smokeless tobacco: Never   Tobacco comments:    tobacco use - no  Substance and Sexual Activity   Alcohol use: No   Drug use: No   Sexual activity: Not on file  Other Topics Concern   Not on file  Social History Narrative   Married, disabled 16    Social Drivers of Health   Tobacco Use: Low  Risk (08/14/2024)   Patient History    Smoking Tobacco Use: Never    Smokeless Tobacco Use: Never    Passive Exposure: Not on file  Financial Resource Strain: Not on file  Food Insecurity: No Food Insecurity (07/27/2022)   Hunger Vital Sign    Worried About Running Out of Food in the Last Year: Never true    Ran Out of Food in the Last Year: Never true  Transportation Needs: No Transportation Needs (07/27/2022)   PRAPARE - Administrator, Civil Service (Medical): No    Lack of Transportation (Non-Medical): No  Physical Activity: Not on file  Stress: Not on file  Social Connections: Not on file  Intimate Partner Violence: Not At Risk (04/19/2024)   Received from [facility]   HITS    Over the last 12 months how often did your partner physically hurt you?: Never    Over the last 12 months how often did your partner insult you or talk down to you?: Never    Over the last 12 months how often did your partner threaten you with physical harm?: Never    Over the last 12 months how often did your partner scream or curse at you?: Never  Depression (PHQ2-9): Low Risk (07/27/2022)   Depression (PHQ2-9)    PHQ-2 Score: 0  Alcohol Screen: Not on file  Housing: Low Risk (07/27/2022)   Housing    Last Housing Risk Score: 0  Utilities: Not At Risk (07/27/2022)   AHC Utilities    Threatened with loss of utilities: No  Health Literacy: Not on file    PHYSICAL EXAM  GENERAL EXAM/CONSTITUTIONAL: Vitals:  Vitals:   08/14/24 0855  BP: 138/78  Pulse: (!)  105  SpO2: 94%  Weight: 197 lb (89.4 kg)  Height: 5' 9 (1.753 m)   Body mass index is 29.09 kg/m. Wt Readings from Last 3 Encounters:  08/14/24 197 lb (89.4 kg)  06/09/24 194 lb 3.2 oz (88.1 kg)  06/14/23 194 lb 12.8 oz (88.4 kg)   Patient is in no distress; well developed, nourished and groomed; neck is supple  MUSCULOSKELETAL: Gait, strength, tone, movements noted in Neurologic exam below  NEUROLOGIC: MENTAL STATUS:      No data to display         awake, alert, oriented to person, place and time recent and remote memory intact normal attention and concentration language fluent, comprehension intact, naming intact fund of knowledge appropriate  CRANIAL NERVE:  2nd, 3rd, 4th, 6th - Visual fields full to confrontation, extraocular muscles intact, no nystagmus 5th - facial sensation symmetric 7th - facial strength symmetric 8th - hearing intact 9th - palate elevates symmetrically, uvula midline 11th - shoulder shrug symmetric 12th - tongue protrusion midline  MOTOR:  normal bulk and tone, full strength in the BUE, BLE  SENSORY:  normal and symmetric to light touch  COORDINATION:  Action tremors present during FNF, right worse than left  GAIT/STATION:  normal     DIAGNOSTIC DATA (LABS, IMAGING, TESTING) - I reviewed patient records, labs, notes, testing and imaging myself where available.  Lab Results  Component Value Date   WBC 7.7 05/02/2022   HGB 14.4 05/02/2022   HCT 42.6 05/02/2022   MCV 87 05/02/2022   PLT 237 05/02/2022      Component Value Date/Time   NA 141 05/02/2022 0923   K 4.0 05/02/2022 0923   CL 104 05/02/2022 0923   CO2 24 05/02/2022 9076  GLUCOSE 119 (H) 05/02/2022 0923   GLUCOSE 106 (H) 03/01/2022 0419   BUN 15 05/02/2022 0923   CREATININE 1.04 (H) 05/02/2022 0923   CALCIUM 9.4 05/02/2022 0923   PROT 6.8 04/21/2008 1410   ALBUMIN 3.9 04/21/2008 1410   AST 21 04/21/2008 1410   ALT 22 04/21/2008 1410   ALKPHOS 54  04/21/2008 1410   BILITOT 0.6 04/21/2008 1410   GFRNONAA >60 03/01/2022 0419   GFRAA >60 09/29/2015 1126   Lab Results  Component Value Date   CHOL 111 04/21/2008   HDL 83.7 04/21/2008   LDLCALC 20 04/21/2008   TRIG 38 04/21/2008   CHOLHDL 1.3 CALC 04/21/2008   No results found for: HGBA1C No results found for: VITAMINB12 Lab Results  Component Value Date   TSH 0.693 02/28/2022     ASSESSMENT AND PLAN  74 y.o. year old female with    Seizure-like episodes (suspected focal seizures) Two episodes of staring and confusion lasting approximately two hours each, occurring in September and November. No history of seizures, brain injury, or stroke. Differential diagnosis includes focal seizures versus TIA or stroke. High suspicion for focal seizures due semiology. Low suspicion for stroke or TIA due to lack of focal neurological deficits and recurrence on Eliquis . - Ordered EEG in the office for 30 minutes. - If office EEG is normal, will order home EEG for up to three days. - Ordered MRI of the brain. - Will discuss medication options if EEG shows abnormalities.  Benign essential tremor Action tremor, more pronounced in the right hand, consistent with benign essential tremor. Family history of tremors. Tremor worsens with anxiety, poor sleep, or illness. Currently not significantly impacting daily activities, but affects handwriting. - Consider weighted bracelets and utensils to manage tremor.  - Contact us  if they do get worse and impacting your daily activity, at that time we will consider medication    1. Seizure-like activity (HCC)   2. Benign essential tremor      Patient Instructions  Routine EEG, if normal we will proceed with a 3-days ambulatory EEG If any abnormality, we will initiate antiseizure medication and obtain a brain MRI Please contact me if you do have another event Please continue to monitor the tremors, and let us  know if they do get worse Return  sooner if worse  Orders Placed This Encounter  Procedures   EEG adult    No orders of the defined types were placed in this encounter.   Return if symptoms worsen or fail to improve.    Pastor Falling, MD 08/14/2024, 9:38 AM  [facility] [address] [place], [place] [address] [phone]     [1]  Allergies Allergen Reactions   Botox [Botulinum Toxin Type A] Swelling   Bactrim [Sulfamethoxazole-Trimethoprim] Rash    Double strength    Penicillins Rash    Has patient had a PCN reaction causing immediate rash, facial/tongue/throat swelling, SOB or lightheadedness with hypotension: Yes Has patient had a PCN reaction causing severe rash involving mucus membranes or skin necrosis: No Has patient had a PCN reaction that required hospitalization No Has patient had a PCN reaction occurring within the last 10 years: No If all of the above answers are NO, then may proceed with Cephalosporin use.    "

## 2024-08-24 ENCOUNTER — Ambulatory Visit: Admitting: Neurology

## 2024-08-24 DIAGNOSIS — R569 Unspecified convulsions: Secondary | ICD-10-CM

## 2024-09-01 ENCOUNTER — Ambulatory Visit: Payer: Self-pay | Admitting: Neurology

## 2024-09-01 DIAGNOSIS — R569 Unspecified convulsions: Secondary | ICD-10-CM

## 2024-09-01 NOTE — Procedures (Signed)
" ° ° °  History:  74 year old with seizure like activity   EEG classification: Awake and drowsy  Duration: 25 minutes   Technical aspects: This EEG study was done with scalp electrodes positioned according to the 10-20 International system of electrode placement. Electrical activity was reviewed with band pass filter of 1-70Hz , sensitivity of 7 uV/mm, display speed of 84mm/sec with a 60Hz  notched filter applied as appropriate. EEG data were recorded continuously and digitally stored.   Description of the recording: The background rhythms of this recording consists of a fairly well modulated medium amplitude alpha rhythm of 9 Hz that is reactive to eye opening and closure. Present in the anterior head region is a 15-20 Hz beta activity. Photic stimulation was performed, did not show any abnormalities. Hyperventilation was also performed, did not show any abnormalities. Drowsiness was manifested by background fragmentation. No abnormal epileptiform discharges seen during this recording. There was intermittent diffuse slowing. There were no electrographic seizure identified.   Abnormality: Intermittent diffuse slowing   Impression: This study is suggestive of a generalized brain dysfunction, such as encephalopathy, mild, nonspecific etiology. There were no seizures.    Pastor Falling, MD [facility]  "

## 2024-09-02 NOTE — Telephone Encounter (Signed)
 Pt called about 3 day Video EEG.  I gave her the [facility] #.  Explained process.  She will follow up in a week by calling them. Melody Huerta given form to complete and fax.  Pt verbalized understanding.

## 2024-09-03 ENCOUNTER — Telehealth: Payer: Self-pay

## 2024-09-03 NOTE — Telephone Encounter (Signed)
 Melody Huerta
